# Patient Record
Sex: Female | Born: 1970
Health system: Southern US, Community
[De-identification: ages and names within clinical notes are randomized; demographics above are authoritative.]

## PROBLEM LIST (undated history)

## (undated) DIAGNOSIS — B009 Herpesviral infection, unspecified: Secondary | ICD-10-CM

## (undated) DIAGNOSIS — D573 Sickle-cell trait: Secondary | ICD-10-CM

## (undated) DIAGNOSIS — I10 Essential (primary) hypertension: Secondary | ICD-10-CM

## (undated) DIAGNOSIS — R079 Chest pain, unspecified: Secondary | ICD-10-CM

## (undated) DIAGNOSIS — D219 Benign neoplasm of connective and other soft tissue, unspecified: Secondary | ICD-10-CM

## (undated) HISTORY — DX: Essential (primary) hypertension: I10

## (undated) HISTORY — DX: Chest pain, unspecified: R07.9

## (undated) HISTORY — DX: Herpesviral infection, unspecified: B00.9

## (undated) HISTORY — DX: Sickle-cell trait: D57.3

## (undated) HISTORY — DX: Benign neoplasm of connective and other soft tissue, unspecified: D21.9

## (undated) HISTORY — PX: HAMMER TOE SURGERY: SHX385

---

## 2007-04-09 DIAGNOSIS — D219 Benign neoplasm of connective and other soft tissue, unspecified: Secondary | ICD-10-CM

## 2007-04-09 HISTORY — DX: Benign neoplasm of connective and other soft tissue, unspecified: D21.9

## 2007-04-09 HISTORY — PX: LAPAROSCOPIC TUBAL LIGATION: SUR803

## 2007-04-09 HISTORY — PX: MYOMECTOMY: SHX85

## 2013-08-27 ENCOUNTER — Encounter: Payer: Self-pay | Admitting: Gynecology

## 2013-09-22 ENCOUNTER — Ambulatory Visit (INDEPENDENT_AMBULATORY_CARE_PROVIDER_SITE_OTHER): Payer: Commercial Managed Care - PPO | Admitting: Gynecology

## 2013-09-22 ENCOUNTER — Encounter: Payer: Self-pay | Admitting: Gynecology

## 2013-09-22 VITALS — BP 112/66 | Resp 18 | Ht 61.75 in | Wt 134.0 lb

## 2013-09-22 DIAGNOSIS — Z01419 Encounter for gynecological examination (general) (routine) without abnormal findings: Secondary | ICD-10-CM

## 2013-09-22 DIAGNOSIS — Z124 Encounter for screening for malignant neoplasm of cervix: Secondary | ICD-10-CM

## 2013-09-22 DIAGNOSIS — B009 Herpesviral infection, unspecified: Secondary | ICD-10-CM | POA: Insufficient documentation

## 2013-09-22 DIAGNOSIS — D573 Sickle-cell trait: Secondary | ICD-10-CM | POA: Insufficient documentation

## 2013-09-22 DIAGNOSIS — Z0000 Encounter for general adult medical examination without abnormal findings: Secondary | ICD-10-CM

## 2013-09-22 DIAGNOSIS — I10 Essential (primary) hypertension: Secondary | ICD-10-CM | POA: Insufficient documentation

## 2013-09-22 LAB — POCT URINALYSIS DIPSTICK
Leukocytes, UA: NEGATIVE
Urobilinogen, UA: NEGATIVE
pH, UA: 5

## 2013-09-22 NOTE — Progress Notes (Signed)
43 y.o. Married  [place] American female   [phone] here for annual exam. Pt is currently sexually active. Cycles regular and light.  Not sexually active due to husband's issues.  Patient's last menstrual period was 09/13/2013.          Sexually active: yes The current method of family planning is BTL   Exercising: no  The patient does not participate in regular exercise at present. Last pap: 2014-wnl Alcohol: 3x/month occasionally  Tobacco: no BSE: no Mammogram- 2014   Urine: Negative    There are no preventive care reminders to display for this patient.  Family History  Problem Relation Age of Onset  . Breast cancer Maternal Aunt   . Diabetes Mellitus I Mother   . Diabetes Maternal Grandmother   . Hypertension Mother   . Hypertension Father   . Hypertension Maternal Grandmother   . Hypertension Sister   . Stroke Sister 40    HTN  . Heart attack Maternal Grandfather   . Heart attack Mother 64    Patient Active Problem List   Diagnosis Date Noted  . Hypertension   . Sickle cell trait     Past Medical History  Diagnosis Date  . Sickle cell trait   . Fibroid 2009  . Hypertension     Past Surgical History  Procedure Laterality Date  . Hammer toe surgery    . Myomectomy  2009  . Laparoscopic tubal ligation  2009    Allergies: Review of patient's allergies indicates no known allergies.  Current Outpatient Prescriptions  Medication Sig Dispense Refill  . ATENOLOL PO Take by mouth.      . Multiple Vitamins-Minerals (MULTIVITAMIN PO) Take by mouth.       No current facility-administered medications for this visit.    ROS: Pertinent items are noted in HPI.  Exam:    BP 112/66  Resp 18  Ht 5' 1.75" (1.568 m)  Wt 134 lb (60.782 kg)  BMI 24.72 kg/m2  LMP 09/13/2013 Weight change: @WEIGHTCHANGE @ Last 3 height recordings:  Ht Readings from Last 3 Encounters:  09/22/13 5' 1.75" (1.568 m)   General appearance: alert, cooperative and appears stated age Head:  Normocephalic, without obvious abnormality, atraumatic Neck: no adenopathy, no carotid bruit, no JVD, supple, symmetrical, trachea midline and thyroid not enlarged, symmetric, no tenderness/mass/nodules Lungs: clear to auscultation bilaterally Breasts: normal appearance, no masses or tenderness Heart: regular rate and rhythm, S1, S2 normal, no murmur, click, rub or gallop Abdomen: soft, non-tender; bowel sounds normal; no masses,  no organomegaly Extremities: extremities normal, atraumatic, no cyanosis or edema Skin: Skin color, texture, turgor normal. No rashes or lesions Lymph nodes: Cervical, supraclavicular, and axillary nodes normal. no inguinal nodes palpated Neurologic: Grossly normal   Pelvic: External genitalia:  normal escutcheon              Urethra: normal appearing urethra with no masses, tenderness or lesions              Bartholins and Skenes: Bartholin's, Urethra, Skene's normal                 Vagina: normal appearing vagina with normal color and discharge, no lesions              Cervix: normal appearance              Pap taken: yes        Bimanual Exam:  Uterus:  irregular, shape, consistency and nontender  Adnexa:    normal adnexa in size, nontender and no masses                                      Rectovaginal: Confirms                                      Anus:  normal sphincter tone, no lesions  A: well woman P: mammogram pap smear with HPV counseled on breast self exam, mammography screening, adequate intake of calcium and vitamin D, diet and exercise return annually or prn   An After Visit Summary was printed and given to the patient.

## 2013-09-27 LAB — IPS PAP TEST WITH HPV

## 2014-02-02 ENCOUNTER — Other Ambulatory Visit: Payer: Self-pay | Admitting: Gynecology

## 2014-02-02 ENCOUNTER — Telehealth: Payer: Self-pay | Admitting: Gynecology

## 2014-02-02 DIAGNOSIS — Z1231 Encounter for screening mammogram for malignant neoplasm of breast: Secondary | ICD-10-CM

## 2014-02-02 NOTE — Telephone Encounter (Signed)
Spoke with patient. Patient states that she had her cycle 10/6-10/9 then started bleeding again on 10/23-10/25. "My cycles are usually only three days long. This has never happened before." Patient's current method of birth control is a tubal ligation. Patient would like to come in to see Dr.Lathrop. Requesting appointment for 11/6. Appointment scheduled for 8:30am on 11/6 with Dr.Lathrop. Patient is agreeable to date and time.  Routing to provider for final review. Patient agreeable to disposition. Will close encounter

## 2014-02-02 NOTE — Telephone Encounter (Signed)
Pt is calling stating she had her cycle 10/6/-01/14/14 pt then had more bleeding on 01/28/14-01/29/14. Pt states her cycle usually only last about 3 days. Pt concerned thinks she may need appt

## 2014-02-07 ENCOUNTER — Encounter: Payer: Self-pay | Admitting: Gynecology

## 2014-02-11 ENCOUNTER — Ambulatory Visit: Payer: Commercial Managed Care - PPO | Admitting: Certified Nurse Midwife

## 2014-02-11 ENCOUNTER — Telehealth: Payer: Self-pay | Admitting: Certified Nurse Midwife

## 2014-02-11 ENCOUNTER — Ambulatory Visit: Payer: Commercial Managed Care - PPO | Admitting: Gynecology

## 2014-02-11 ENCOUNTER — Ambulatory Visit (HOSPITAL_COMMUNITY)
Admission: RE | Admit: 2014-02-11 | Discharge: 2014-02-11 | Disposition: A | Discharge status: Home / Self Care | Payer: Commercial Managed Care - PPO | Source: Ambulatory Visit | Attending: Gynecology | Admitting: Gynecology

## 2014-02-11 DIAGNOSIS — Z1231 Encounter for screening mammogram for malignant neoplasm of breast: Secondary | ICD-10-CM | POA: Insufficient documentation

## 2014-02-11 NOTE — Telephone Encounter (Signed)
Patient called to say she did not keep her appointment for "irregular cycles" with Johny Shock today due to starting her cycle last night. Patient advised to keep future appointments for this problem even if she is on her cycle. Patient will call back to reschedule and expressed understanding. I did not charge patient a missed appointment fee.

## 2014-06-22 ENCOUNTER — Telehealth: Payer: Self-pay | Admitting: Nurse Practitioner

## 2014-06-22 NOTE — Telephone Encounter (Signed)
°   ° ° °  Closed previous encounter by error. Changed to high priorty  Lucienne Minks at 06/22/2014 4:25 PM     Status: Signed       Expand All Collapse All   Patient is calling to speak with the nurse. Patient is on her break.            Zeb Comfort at 06/22/2014 2:07 PM     Status: Signed       Expand All Collapse All   Patient found a "pelvic lump". Patient is on a break will not be available again until 4:15-4:30 her next break. Last seen 09/22/13. No chart.

## 2014-06-22 NOTE — Telephone Encounter (Signed)
Call to patient. She is at work and cannot provide details to this Therapist, sports.  Prior patient of Dr. Charlies Constable. Concerned about lump in L vulvar area that she noticed two days ago. Also concerned about irregular cycles.  She declines any triage. Requests appointment with an MD only. Declines all offered appointments with NP's.  Patient also requests an office visit during lunch time hours from 12 to 1 pm with MD. Hanley Seamen first available appointment with Dr Quincy Simmonds for 06/27/14. Patient will call back with any change in symptoms prior.   Routing to provider for final review. Patient agreeable to disposition. Will close encounter

## 2014-06-22 NOTE — Telephone Encounter (Signed)
Patient is calling to speak with the nurse. Patient is on her break.

## 2014-06-22 NOTE — Telephone Encounter (Signed)
Patient found a "pelvic lump". Patient is on a break will not be available again until 4:15-4:30 her next break. Last seen 09/22/13. No chart.

## 2014-06-27 ENCOUNTER — Ambulatory Visit (INDEPENDENT_AMBULATORY_CARE_PROVIDER_SITE_OTHER): Payer: Commercial Managed Care - PPO | Admitting: Certified Nurse Midwife

## 2014-06-27 ENCOUNTER — Encounter: Payer: Self-pay | Admitting: Certified Nurse Midwife

## 2014-06-27 ENCOUNTER — Ambulatory Visit: Payer: Commercial Managed Care - PPO | Admitting: Obstetrics and Gynecology

## 2014-06-27 ENCOUNTER — Telehealth: Payer: Self-pay | Admitting: Emergency Medicine

## 2014-06-27 VITALS — BP 100/80 | HR 68 | Temp 98.0°F | Ht 61.75 in | Wt 146.0 lb

## 2014-06-27 DIAGNOSIS — L731 Pseudofolliculitis barbae: Secondary | ICD-10-CM

## 2014-06-27 NOTE — Telephone Encounter (Signed)
Spoke with patient. Advised of need to reschedule appointment due to provider availability.  She requests to be rescheduled with any provider today as she has taken off of work to be seen today.   Rescheduled appointment with Regina Eck CNM for evaluation of Vulvar Lump.  Routing to provider for final review. Patient agreeable to disposition. Will close encounter

## 2014-06-27 NOTE — Patient Instructions (Signed)
°

## 2014-06-27 NOTE — Progress Notes (Signed)
44 y.o.Married african american female [id number] here with complaint of vaginal bump on left side. Denies pain, itching, exudate, redness or blister. History of HSV 2.  Describes discharge as  Normal. Patient does not wear thongs, does shave in pubic area or clip.Onset of symptoms 1-2 weeks ago or maybe longer . Denies new personal products. No STD concerns. Urinary symptoms none . Contraception is BTL. Patient also has bump behind right ear that she saw PCP for this am and will be having CT scan for. No other health issues today.   O:Healthy female WDWN Affect: normal, orientation x 3  Exam: Abdomen:soft, non tender  Inguinal Lymph node: no enlargement or tenderness Pelvic exam: External genital: normal female with small ingrown hair noted on left mons pubis, non tender, no exudate, barely raised. BUS: negative Declines vaginal exam due to location of bump, patient acknowledges this is the area of concern.  Wet Prep results: not done   A:Ingrown pubic hair no inflammation noted   P:Discussed findings of ingrown hair bump and etiology. Discussed epsom salt sitz bath or soak, which will help resolve area. Avoid shaving area or cutting close. Warning signs of inflammation or infection given. Avoid squeezing area which will cause irritation. Should spontaneous clear or no change.  Rv prn

## 2014-07-06 NOTE — Progress Notes (Signed)
Reviewed personally.  M. Suzanne Darrion Macaulay, MD.  

## 2014-09-23 ENCOUNTER — Telehealth: Payer: Self-pay | Admitting: Nurse Practitioner

## 2014-09-23 NOTE — Telephone Encounter (Signed)
Left patient a message to call back when ready to reschedule, canceled by automated reminder call. °

## 2014-09-27 ENCOUNTER — Ambulatory Visit: Payer: Commercial Managed Care - PPO | Admitting: Nurse Practitioner

## 2015-02-09 ENCOUNTER — Other Ambulatory Visit: Payer: Self-pay

## 2015-02-09 DIAGNOSIS — Z1231 Encounter for screening mammogram for malignant neoplasm of breast: Secondary | ICD-10-CM

## 2015-02-23 ENCOUNTER — Encounter: Payer: Self-pay | Admitting: Certified Nurse Midwife

## 2015-02-23 ENCOUNTER — Ambulatory Visit (INDEPENDENT_AMBULATORY_CARE_PROVIDER_SITE_OTHER): Payer: Commercial Managed Care - PPO | Admitting: Certified Nurse Midwife

## 2015-02-23 VITALS — BP 104/64 | HR 68 | Resp 16 | Ht 61.5 in | Wt 143.0 lb

## 2015-02-23 DIAGNOSIS — Z124 Encounter for screening for malignant neoplasm of cervix: Secondary | ICD-10-CM

## 2015-02-23 DIAGNOSIS — Z0000 Encounter for general adult medical examination without abnormal findings: Secondary | ICD-10-CM

## 2015-02-23 DIAGNOSIS — Z01419 Encounter for gynecological examination (general) (routine) without abnormal findings: Secondary | ICD-10-CM | POA: Diagnosis not present

## 2015-02-23 LAB — POCT URINALYSIS DIPSTICK
Bilirubin, UA: NEGATIVE
Blood, UA: NEGATIVE
Glucose, UA: NEGATIVE
Ketones, UA: NEGATIVE
Leukocytes, UA: NEGATIVE
Nitrite, UA: NEGATIVE
Protein, UA: NEGATIVE
Urobilinogen, UA: NEGATIVE
pH, UA: 7

## 2015-02-23 NOTE — Patient Instructions (Signed)

## 2015-02-23 NOTE — Progress Notes (Signed)
44 y.o. [id number] Married  African American Fe here for annual exam. Periods normal, no issues. Seeing PCP for Hypertension management,labs/aex. All normal. Working on getting motivated for weight loss. Had fall today and hit head, but no vision changes or headaches. Aware if symptoms occur needs to be seen in ER. May be changing employment in next year to [employer]. No other health issues today.  Patient's last menstrual period was 01/23/2015.          Sexually active: Yes.    The current method of family planning is tubal ligation.    Exercising: No.  exercise Smoker:  no  Health Maintenance: Pap: 09-22-13 ASCUS HPV HR neg MMG:  02-11-14 category c birads 1:neg scheduled for 02/24/15 Colonoscopy:  none BMD:   none TDaP:  2015 Labs: poct urine-ph 7.0 Self breast exam: done occ   reports that she has never smoked. She has never used smokeless tobacco. She reports that she drinks alcohol. She reports that she does not use illicit drugs.  Past Medical History  Diagnosis Date  . Sickle cell trait ([place])   . Fibroid 2009  . Hypertension   . Herpes     Past Surgical History  Procedure Laterality Date  . Hammer toe surgery    . Myomectomy  2009  . Laparoscopic tubal ligation  2009    Current Outpatient Prescriptions  Medication Sig Dispense Refill  . atenolol-chlorthalidone (TENORETIC) 50-25 MG per tablet Take 1 tablet by mouth daily.  5   No current facility-administered medications for this visit.    Family History  Problem Relation Age of Onset  . Breast cancer Maternal Aunt   . Diabetes Mellitus I Mother   . Diabetes Maternal Grandmother   . Hypertension Mother   . Hypertension Father   . Hypertension Maternal Grandmother   . Hypertension Sister   . Stroke Sister 72    HTN  . Heart attack Maternal Grandfather   . Heart attack Mother 59    ROS:  Pertinent items are noted in HPI.  Otherwise, a comprehensive ROS was negative.  Exam:   BP 104/64 mmHg  Pulse 68   Resp 16  Ht 5' 1.5" (1.562 m)  Wt 143 lb (64.864 kg)  BMI 26.59 kg/m2  LMP 01/23/2015 Height: 5' 1.5" (156.2 cm) Ht Readings from Last 3 Encounters:  02/23/15 5' 1.5" (1.562 m)  06/27/14 5' 1.75" (1.568 m)  09/22/13 5' 1.75" (1.568 m)    General appearance: alert, cooperative and appears stated age Head: Normocephalic, without obvious abnormality, atraumatic Neck: no adenopathy, supple, symmetrical, trachea midline and thyroid normal to inspection and palpation Lungs: clear to auscultation bilaterally Breasts: normal appearance, no masses or tenderness, No nipple retraction or dimpling, No nipple discharge or bleeding, No axillary or supraclavicular adenopathy Heart: regular rate and rhythm Abdomen: soft, non-tender; no masses,  no organomegaly Extremities: extremities normal, atraumatic, no cyanosis or edema Skin: Skin color, texture, turgor normal. No rashes or lesions Lymph nodes: Cervical, supraclavicular, and axillary nodes normal. No abnormal inguinal nodes palpated Neurologic: Grossly normal   Pelvic: External genitalia:  no lesions              Urethra:  normal appearing urethra with no masses, tenderness or lesions              Bartholin's and Skene's: normal                 Vagina: normal appearing vagina with normal color and discharge, no  lesions              Cervix: normal,non tender,no lesions              Pap taken: Yes.   Bimanual Exam:  Uterus:  normal size, contour, position, consistency, mobility, non-tender              Adnexa: normal adnexa and no mass, fullness, tenderness               Rectovaginal: Confirms               Anus:  normal sphincter tone, no lesions  Chaperone present: yes  A:  Well Woman with normal exam  Contraception BTL  History of ASCUS - HPVHR, follow up pap today  Hypertension with PCP management, stable  P:   Reviewed health and wellness pertinent to exam  Discussed with patient if pap normal repeat in one year, if not per  results.  Continue follow up as indicated with PCP  Pap smear as above taken with HPVHR   counseled on breast self exam, mammography screening, adequate intake of calcium and vitamin D, diet and exercise return annually or prn  An After Visit Summary was printed and given to the patient.

## 2015-02-24 ENCOUNTER — Ambulatory Visit
Admission: RE | Admit: 2015-02-24 | Discharge: 2015-02-24 | Disposition: A | Discharge status: Home / Self Care | Payer: Commercial Managed Care - PPO | Source: Ambulatory Visit

## 2015-02-24 DIAGNOSIS — Z1231 Encounter for screening mammogram for malignant neoplasm of breast: Secondary | ICD-10-CM

## 2015-02-26 NOTE — Progress Notes (Signed)
Reviewed personally.  M. Suzanne Soliyana Mcchristian, MD.  

## 2015-02-27 LAB — IPS PAP TEST WITH HPV

## 2015-05-01 ENCOUNTER — Other Ambulatory Visit: Payer: Self-pay | Admitting: Family Medicine

## 2015-05-01 DIAGNOSIS — H9209 Otalgia, unspecified ear: Secondary | ICD-10-CM

## 2015-05-02 ENCOUNTER — Ambulatory Visit
Admission: RE | Admit: 2015-05-02 | Discharge: 2015-05-02 | Disposition: A | Discharge status: Home / Self Care | Payer: Commercial Managed Care - PPO | Source: Ambulatory Visit | Attending: Family Medicine | Admitting: Family Medicine

## 2015-05-02 DIAGNOSIS — H9209 Otalgia, unspecified ear: Secondary | ICD-10-CM

## 2015-05-02 MED ORDER — IOPAMIDOL (ISOVUE-300) INJECTION 61%
75.0000 mL | Freq: Once | INTRAVENOUS | Status: AC | PRN
  Administered 2015-05-02: 75 mL via INTRAVENOUS

## 2016-02-21 ENCOUNTER — Other Ambulatory Visit: Payer: Self-pay | Admitting: Family Medicine

## 2016-02-21 DIAGNOSIS — Z1231 Encounter for screening mammogram for malignant neoplasm of breast: Secondary | ICD-10-CM

## 2016-02-27 ENCOUNTER — Encounter: Payer: Self-pay | Admitting: Certified Nurse Midwife

## 2016-02-27 ENCOUNTER — Ambulatory Visit (INDEPENDENT_AMBULATORY_CARE_PROVIDER_SITE_OTHER): Payer: Commercial Managed Care - PPO | Admitting: Certified Nurse Midwife

## 2016-02-27 VITALS — BP 112/68 | HR 70 | Resp 16 | Ht 61.75 in | Wt 139.0 lb

## 2016-02-27 DIAGNOSIS — Z0000 Encounter for general adult medical examination without abnormal findings: Secondary | ICD-10-CM

## 2016-02-27 DIAGNOSIS — Z01419 Encounter for gynecological examination (general) (routine) without abnormal findings: Secondary | ICD-10-CM | POA: Diagnosis not present

## 2016-02-27 LAB — TSH: TSH: 0.81 mIU/L

## 2016-02-27 LAB — POCT URINALYSIS DIPSTICK
BILIRUBIN UA: NEGATIVE
Blood, UA: NEGATIVE
Glucose, UA: NEGATIVE
KETONES UA: NEGATIVE
LEUKOCYTES UA: NEGATIVE
NITRITE UA: NEGATIVE
PH UA: 5
PROTEIN UA: NEGATIVE
Urobilinogen, UA: NEGATIVE

## 2016-02-27 LAB — LIPID PANEL
CHOLESTEROL: 168 mg/dL (ref <200)
HDL: 70 mg/dL (ref >50)
LDL Cholesterol: 82 mg/dL (ref <100)
TRIGLYCERIDES: 79 mg/dL (ref <150)
Total CHOL/HDL Ratio: 2.4 Ratio (ref <5.0)
VLDL: 16 mg/dL (ref <30)

## 2016-02-27 NOTE — Progress Notes (Signed)
45 y.o. [id number] Married  African American Fe here for annual exam. Periods normal, no issues. Patient has been on folic acid in past and plans to start again. 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 Sees [facility] for atenolol for hypertension management , just established, no screening labs. Aware mammogram due and plans to schedule soon. No other health issues today. Spouse cooking Thanksgiving!  Patient's last menstrual period was 01/22/2016 (exact date).          Sexually active: No.  The current method of family planning is tubal ligation.    Exercising: No.  exercise Smoker:  no  Health Maintenance: Pap:  02-23-15 neg HPV HR neg MMG:  02-24-15 category c density birads 1:neg Colonoscopy:  none BMD:   none TDaP:  2015 Shingles: no Pneumonia: no Hep C and HIV: HIV neg yrs ago Labs: poct urine-neg Self breast exam: done occ   reports that she has never smoked. She has never used smokeless tobacco. She reports that she drinks alcohol. She reports that she does not use drugs.  Past Medical History:  Diagnosis Date  . Fibroid 2009  . Herpes   . Hypertension   . Sickle cell trait [facility])     Past Surgical History:  Procedure Laterality Date  . HAMMER TOE SURGERY    . LAPAROSCOPIC TUBAL LIGATION  2009  . MYOMECTOMY  2009    Current Outpatient Prescriptions  Medication Sig Dispense Refill  . atenolol-chlorthalidone (TENORETIC) 50-25 MG per tablet Take 1 tablet by mouth daily.  5   No current facility-administered medications for this visit.     Family History  Problem Relation Age of Onset  . Breast cancer Maternal Aunt   . Diabetes Mellitus I Mother   . Hypertension Mother   . Heart attack Mother 39  . Diabetes Maternal Grandmother   . Hypertension Maternal Grandmother   . Hypertension Father   . Hypertension Sister   . Stroke Sister 66    HTN  . Heart attack Maternal Grandfather     ROS:  Pertinent items are noted in HPI.  Otherwise, a comprehensive ROS was  negative.  Exam:   BP 112/68   Pulse 70   Resp 16   Ht 5' 1.75" (1.568 m)   Wt 139 lb (63 kg)   LMP 01/22/2016 (Exact Date) Comment: spotting today  BMI 25.63 kg/m  Height: 5' 1.75" (156.8 cm) Ht Readings from Last 3 Encounters:  02/27/16 5' 1.75" (1.568 m)  02/23/15 5' 1.5" (1.562 m)  06/27/14 5' 1.75" (1.568 m)    General appearance: alert, cooperative and appears stated age Head: Normocephalic, without obvious abnormality, atraumatic Neck: no adenopathy, supple, symmetrical, trachea midline and thyroid normal to inspection and palpation Lungs: clear to auscultation bilaterally Breasts: normal appearance, no masses or tenderness, No nipple retraction or dimpling, No nipple discharge or bleeding, No axillary or supraclavicular adenopathy Heart: regular rate and rhythm Abdomen: soft, non-tender; no masses,  no organomegaly Extremities: extremities normal, atraumatic, no cyanosis or edema Skin: Skin color, texture, turgor normal. No rashes or lesions Lymph nodes: Cervical, supraclavicular, and axillary nodes normal. No abnormal inguinal nodes palpated Neurologic: Grossly normal   Pelvic: External genitalia:  no lesions              Urethra:  normal appearing urethra with no masses, tenderness or lesions              Bartholin's and Skene's: normal  Vagina: normal appearing vagina with normal color and discharge, no lesions              Cervix: multiparous appearance, no cervical motion tenderness and no lesions              Pap taken: No. Bimanual Exam:  Uterus:  normal size, contour, position, consistency, mobility, non-tender and anteverted              Adnexa: normal adnexa and no mass, fullness, tenderness               Rectovaginal: Confirms               Anus:  normal sphincter tone, no lesions  Chaperone present: yes  A:  Well Woman with normal exam  Contraception tubal  Hypertension with PCP management  Screening labs  P:   Reviewed health and  wellness pertinent to exam  Continue follow up with MD as indicated.  Labs: Lipid panel, TSH, Vitamin D  Pap smear as above not taken   counseled on breast self exam, mammography screening, adequate intake of calcium and vitamin D, diet and exercise  return annually or prn  An After Visit Summary was printed and given to the patient.

## 2016-02-27 NOTE — Patient Instructions (Signed)

## 2016-02-28 ENCOUNTER — Other Ambulatory Visit: Payer: Self-pay

## 2016-02-28 ENCOUNTER — Telehealth: Payer: Self-pay

## 2016-02-28 DIAGNOSIS — E559 Vitamin D deficiency, unspecified: Secondary | ICD-10-CM

## 2016-02-28 LAB — VITAMIN D 25 HYDROXY (VIT D DEFICIENCY, FRACTURES): VIT D 25 HYDROXY: 19 ng/mL — AB (ref 30–100)

## 2016-02-28 MED ORDER — VITAMIN D (ERGOCALCIFEROL) 1.25 MG (50000 UNIT) PO CAPS
50000.0000 [IU] | ORAL_CAPSULE | ORAL | 0 refills | Status: DC
Start: 2016-02-28 — End: 2016-06-18

## 2016-02-28 NOTE — Telephone Encounter (Signed)
-----   Message from Regina Eck, CNM sent at 02/28/2016  7:49 AM EST ----- Notify patient Vitamin D is low, needs Rx and recheck in 3 months TSH is normal Lipid panel all normal

## 2016-02-28 NOTE — Telephone Encounter (Signed)
lmtcb

## 2016-02-28 NOTE — Telephone Encounter (Signed)
Left message for call back.

## 2016-02-28 NOTE — Telephone Encounter (Signed)
Patient returning call.

## 2016-02-28 NOTE — Telephone Encounter (Signed)
Pt notified in result note.  Closing encounter. 

## 2016-03-01 NOTE — Progress Notes (Signed)
Encounter reviewed Beuford Garcilazo, MD   

## 2016-03-27 ENCOUNTER — Telehealth: Payer: Self-pay | Admitting: Certified Nurse Midwife

## 2016-03-27 NOTE — Telephone Encounter (Addendum)
Patient wants to speak with the nurse. She wants to know what labwork she had done at her last visit. She has an appointment today at her other doctor's office and want to let them know. She can be reached at [phone].

## 2016-03-27 NOTE — Telephone Encounter (Signed)
Left message to call Sharee Pimple at [phone].  Last AEX 02/27/16 -lipid panel, Vit D, TSH

## 2016-04-03 NOTE — Telephone Encounter (Signed)
Left message to call Mical Brun at [phone].  

## 2016-04-09 NOTE — Telephone Encounter (Signed)
Melvia Heaps, CNM -returned call x2 with no return call -please advise?

## 2016-04-09 NOTE — Telephone Encounter (Signed)
Close encounter, she will call if she needs further assistance

## 2016-05-21 ENCOUNTER — Other Ambulatory Visit: Payer: Self-pay | Admitting: Certified Nurse Midwife

## 2016-06-05 ENCOUNTER — Telehealth: Payer: Self-pay | Admitting: *Deleted

## 2016-06-05 NOTE — Telephone Encounter (Signed)
Message left to return call.  [phone] (Mobile) *Preferred*

## 2016-06-05 NOTE — Telephone Encounter (Signed)
-----   Message from Graylon Good, [place] sent at 02/28/2016  1:58 PM EST ----- Regarding: repeat vit d Pt needs vit d 12 weeks from 02/28/16 DL patient.

## 2016-06-11 NOTE — Telephone Encounter (Signed)
Return call from patient.  Lab appointment scheduled for 06/14/16 @ 1:30pm.

## 2016-06-13 ENCOUNTER — Other Ambulatory Visit (INDEPENDENT_AMBULATORY_CARE_PROVIDER_SITE_OTHER): Payer: 59

## 2016-06-13 DIAGNOSIS — E559 Vitamin D deficiency, unspecified: Secondary | ICD-10-CM

## 2016-06-14 ENCOUNTER — Other Ambulatory Visit: Payer: Self-pay

## 2016-06-14 ENCOUNTER — Telehealth: Payer: Self-pay

## 2016-06-14 DIAGNOSIS — E559 Vitamin D deficiency, unspecified: Secondary | ICD-10-CM

## 2016-06-14 LAB — VITAMIN D 25 HYDROXY (VIT D DEFICIENCY, FRACTURES): VIT D 25 HYDROXY: 33 ng/mL (ref 30–100)

## 2016-06-14 NOTE — Telephone Encounter (Signed)
lmtcb

## 2016-06-17 ENCOUNTER — Ambulatory Visit: Payer: Commercial Managed Care - PPO

## 2016-06-17 NOTE — Telephone Encounter (Signed)
Patient is returning a call to [place]. I told patient Caryl Asp has gone for the day. Patient is asking if another nurse could give her a call with her lab results. I told patient that our office is closing early and she may not receive a call back today.

## 2016-06-17 NOTE — Telephone Encounter (Signed)
Left message for call back.

## 2016-06-18 MED ORDER — VITAMIN D (ERGOCALCIFEROL) 1.25 MG (50000 UNIT) PO CAPS
50000.0000 [IU] | ORAL_CAPSULE | ORAL | 0 refills | Status: DC
Start: 2016-06-18 — End: 2017-04-17

## 2016-06-18 NOTE — Telephone Encounter (Signed)
Patient notified by triage.

## 2016-06-18 NOTE — Telephone Encounter (Signed)
Left message to call back  

## 2016-06-18 NOTE — Telephone Encounter (Signed)
Spoke with patient, advised of results and recommendations as seen below per Melvia Heaps, CNM. Rx for Vit D 50,000 IU sent to verified pharmacy on file. Patient scheduled for 3 month Vit D recheck 09/20/16 at 1:30pm. Patient verbalizes understanding and is agreeable.  Routing to provider for final review. Patient is agreeable to disposition. Will close encounter.

## 2016-06-18 NOTE — Telephone Encounter (Signed)
Patient returning call.

## 2016-06-18 NOTE — Telephone Encounter (Signed)
Left message to return call to any triage nurse or Joy [phone].  Notes Recorded by Regina Eck, CNM on 06/14/2016 at 7:56 AM EST Notify patient that Vitamin D is better, borderline normal, continue Rx and recheck in 3 months Please sche.

## 2016-07-26 ENCOUNTER — Encounter: Payer: Self-pay | Admitting: Cardiovascular Disease

## 2016-07-26 DIAGNOSIS — R079 Chest pain, unspecified: Secondary | ICD-10-CM | POA: Diagnosis not present

## 2016-07-26 DIAGNOSIS — I10 Essential (primary) hypertension: Secondary | ICD-10-CM | POA: Diagnosis not present

## 2016-08-12 ENCOUNTER — Telehealth: Payer: Self-pay

## 2016-08-12 NOTE — Telephone Encounter (Signed)
SENT NOTES TO SCHEDULING 

## 2016-08-23 ENCOUNTER — Telehealth: Payer: Self-pay | Admitting: Cardiovascular Disease

## 2016-08-23 NOTE — Telephone Encounter (Signed)
Received records from [facility] for appointment on 08/30/16 with Dr Oval Linsey.  Records put with Dr Blenda Mounts schedule on 08/30/16.

## 2016-08-30 ENCOUNTER — Encounter: Payer: Self-pay | Admitting: Cardiovascular Disease

## 2016-08-30 ENCOUNTER — Ambulatory Visit (INDEPENDENT_AMBULATORY_CARE_PROVIDER_SITE_OTHER): Payer: 59 | Admitting: Cardiovascular Disease

## 2016-08-30 VITALS — BP 125/78 | HR 68 | Ht 61.0 in | Wt 146.6 lb

## 2016-08-30 DIAGNOSIS — R002 Palpitations: Secondary | ICD-10-CM | POA: Diagnosis not present

## 2016-08-30 DIAGNOSIS — R079 Chest pain, unspecified: Secondary | ICD-10-CM | POA: Diagnosis not present

## 2016-08-30 NOTE — Patient Instructions (Addendum)
Medication Instructions:  Your physician recommends that you continue on your current medications as directed. Please refer to the Current Medication list given to you today.  Labwork: FT4/TSH TODAY   Testing/Procedures: Your physician has requested that you have an exercise tolerance test. For further information please visit [email or web address]. Please also follow instruction sheet, as given. HOLD YOUR BLOOD PRESSURE MEDICATION THE DAY BEFORE AND THE MORNING OF  Follow-Up: Your physician wants you to follow-up in: Tiffany Davila will receive a reminder letter in the mail two months in advance. If you don't receive a letter, please call our office to schedule the follow-up appointment.  If you need a refill on your cardiac medications before your next appointment, please call your pharmacy.

## 2016-08-30 NOTE — Progress Notes (Signed)
Cardiology Office Note   Date:  09/01/2016   ID:  Tiffany Davila, DOB 19-Aug-1970, MRN [id number]  PCP:  Tiffany Prose, MD  Cardiologist:   Tiffany Latch, MD   Chief Complaint  Patient presents with  . New Patient (Initial Visit)      History of Present Illness: Tiffany Davila is a 46 y.o. female with hypertension and sickle cell trait who is being seen today for the evaluation of chest pain at the request of Tiffany Prose, MD.  Tiffany Davila saw Dr. Nancy Davila on 07/26/16. She repleted one day of right sided chest tightness radiating to her neck EKG was unremarkable. She was referred to cardiology for further evaluation.  She reports that she has experienced chest tightness off and on over the years.  It is typically right sided and does not radiate.  She describes it as both sharp and tightness that is 3/10 in severity.  There is no associated shortness of breath, nausea or diaphoresis.  Episodes typically occur when she is feeling stressed.  Tiffany Davila works for the [employer] and has a stressful job. She also has two teenage children.  The pain lasts for a few seconds and often reurs throughout the day.  Tiffany Davila doesn't exercise but she does do household chores such as vaccuming and mopping.  She denies chest pain with these activites.  She sometimes has lower extremity edema after standing for long periods of time but it improves with elevation.  She denies orthopnea or PND.   Past Medical History:  Diagnosis Date  . Chest pain   . Fibroid 2009  . Herpes   . Hypertension   . Sickle cell trait [facility])     Past Surgical History:  Procedure Laterality Date  . HAMMER TOE SURGERY    . LAPAROSCOPIC TUBAL LIGATION  2009  . MYOMECTOMY  2009     Current Outpatient Prescriptions  Medication Sig Dispense Refill  . atenolol-chlorthalidone (TENORETIC) 50-25 MG per tablet Take 1 tablet by mouth daily.  5  . Vitamin D, Ergocalciferol, (DRISDOL) 50000 units CAPS capsule Take 1  capsule (50,000 Units total) by mouth every 7 (seven) days. 12 capsule 0   No current facility-administered medications for this visit.     Allergies:   Patient has no known allergies.    Social History:  The patient  reports that she has never smoked. She has never used smokeless tobacco. She reports that she drinks alcohol. She reports that she does not use drugs.   Family History:  The patient's family history includes Breast cancer in her maternal aunt; COPD in her mother; Diabetes in her maternal grandmother; Diabetes Mellitus I in her mother; Heart attack in her maternal grandfather; Heart attack (age of onset: 60) in her mother; Hypertension in her father, maternal grandmother, mother, and sister; Neuropathy in her mother; Stomach cancer in her sister; Stroke (age of onset: 15) in her sister.    ROS:  Please see the history of present illness.   Otherwise, review of systems are positive for hot/cold intolerance.  All other systems are reviewed and negative.    PHYSICAL EXAM: VS:  BP 125/78   Pulse 68   Ht 5\' 1"  (1.549 m)   Wt 66.5 kg (146 lb 9.6 oz)   BMI 27.70 kg/m  , BMI Body mass index is 27.7 kg/m. GENERAL:  Well appearing.  No acute distress HEENT:  Pupils equal round and reactive, fundi not visualized, oral mucosa unremarkable  NECK:  No jugular venous distention, waveform within normal limits, carotid upstroke brisk and symmetric, no bruits, no thyromegaly LYMPHATICS:  No cervical adenopathy LUNGS:  Clear to auscultation bilaterally HEART:  RRR.  PMI not displaced or sustained,S1 and S2 within normal limits, no S3, no S4, no clicks, no rubs, no murmurs ABD:  Flat, positive bowel sounds normal in frequency in pitch, no bruits, no rebound, no guarding, no midline pulsatile mass, no hepatomegaly, no splenomegaly EXT:  2 plus pulses throughout, no edema, no cyanosis no clubbing SKIN:  No rashes no nodules NEURO:  Cranial nerves II through XII grossly intact, motor grossly  intact throughout PSYCH:  Cognitively intact, oriented to person place and time    EKG:  EKG is not ordered today. The ekg ordered 08/30/16 demonstrates sinus rhythm 68 bpm.   07/26/16: Sinus rhythm. Rate 61 bpm.   Recent Labs: 08/30/2016: TSH 0.962   03/2016:  sodium 140, potassium 4.3, BUN 10, creatinine 0.58 WBC 6.9, hemoglobin 14, hematocrit 41.9, platelets 404  04/26/15: Total cholesterol 161, triglycerides 92, HDL 70, LDL 73  Lipid Panel    Component Value Date/Time   CHOL 168 02/27/2016 1603   TRIG 79 02/27/2016 1603   HDL 70 02/27/2016 1603   CHOLHDL 2.4 02/27/2016 1603   VLDL 16 02/27/2016 1603   LDLCALC 82 02/27/2016 1603      Wt Readings from Last 3 Encounters:  08/30/16 66.5 kg (146 lb 9.6 oz)  02/27/16 63 kg (139 lb)  02/23/15 64.9 kg (143 lb)      ASSESSMENT AND PLAN:  # Atypical chest pain: Symptoms are very atypical.  We will get an  ETT to rule out ischemia.  We will also check a TSH, free T4.  # Hypertension: Blood pressure is controlled on atenolol and chlorthalidone.  Current medicines are reviewed at length with the patient today.  The patient does not have concerns regarding medicines.  The following changes have been made:  no change  Labs/ tests ordered today include:   Orders Placed This Encounter  Procedures  . T4, free  . TSH  . Exercise Tolerance Test  . EKG 12-Lead     Disposition:   FU with Tiffany Sol C. Oval Linsey, MD, [facility] in 1 year    This note was written with the assistance of speech recognition software.  Please excuse any transcriptional errors.  Signed, Tiffany Mones C. Oval Linsey, MD, [facility]  09/01/2016 10:24 PM    [place]

## 2016-08-31 LAB — T4, FREE: Free T4: 1.24 ng/dL (ref 0.82–1.77)

## 2016-08-31 LAB — TSH: TSH: 0.962 u[IU]/mL (ref 0.450–4.500)

## 2016-09-04 ENCOUNTER — Telehealth (HOSPITAL_COMMUNITY): Payer: Self-pay

## 2016-09-04 NOTE — Telephone Encounter (Signed)
Message left. Awaiting return call at this time. Encounter complete.

## 2016-09-06 ENCOUNTER — Inpatient Hospital Stay (HOSPITAL_COMMUNITY): Admission: RE | Admit: 2016-09-06 | Payer: 59 | Source: Ambulatory Visit

## 2016-09-09 ENCOUNTER — Telehealth: Payer: Self-pay | Admitting: Cardiovascular Disease

## 2016-09-09 NOTE — Telephone Encounter (Signed)
No answer

## 2016-09-09 NOTE — Telephone Encounter (Signed)
Turning your call,will be at work until 5:00.You can call her after 5:00 at [phone].

## 2016-09-11 NOTE — Telephone Encounter (Signed)
Advised patient of lab results  

## 2016-09-11 NOTE — Telephone Encounter (Signed)
-----   Message from Skeet Latch, MD sent at 09/09/2016  1:08 PM EDT ----- Normal thyroid function

## 2016-09-18 NOTE — Addendum Note (Signed)
Addended by: Graylon Good on: 09/18/2016 05:12 PM   Modules accepted: Orders

## 2016-09-19 ENCOUNTER — Telehealth: Payer: Self-pay | Admitting: Certified Nurse Midwife

## 2016-09-19 ENCOUNTER — Ambulatory Visit
Admission: RE | Admit: 2016-09-19 | Discharge: 2016-09-19 | Disposition: A | Discharge status: Home / Self Care | Payer: 59 | Source: Ambulatory Visit | Attending: Family Medicine | Admitting: Family Medicine

## 2016-09-19 ENCOUNTER — Other Ambulatory Visit: Payer: 59

## 2016-09-19 DIAGNOSIS — Z1231 Encounter for screening mammogram for malignant neoplasm of breast: Secondary | ICD-10-CM

## 2016-09-19 NOTE — Telephone Encounter (Signed)
Patient cancelled her vitamin D recheck appointment today. She will call back to reschedule.

## 2016-09-20 ENCOUNTER — Other Ambulatory Visit: Payer: Self-pay

## 2016-09-20 NOTE — Telephone Encounter (Signed)
Can you follow this

## 2016-09-24 ENCOUNTER — Telehealth (HOSPITAL_COMMUNITY): Payer: Self-pay

## 2016-09-24 NOTE — Telephone Encounter (Signed)
Encounter complete. 

## 2016-09-25 ENCOUNTER — Telehealth (HOSPITAL_COMMUNITY): Payer: Self-pay

## 2016-09-25 NOTE — Telephone Encounter (Signed)
Encounter complete. 

## 2016-09-26 NOTE — Telephone Encounter (Signed)
Left message for patient to callback & schedule vit d lab appt.

## 2016-10-01 ENCOUNTER — Ambulatory Visit (HOSPITAL_COMMUNITY)
Admission: RE | Admit: 2016-10-01 | Discharge: 2016-10-01 | Disposition: A | Discharge status: Home / Self Care | Payer: 59 | Source: Ambulatory Visit | Attending: Cardiovascular Disease | Admitting: Cardiovascular Disease

## 2016-10-01 DIAGNOSIS — R079 Chest pain, unspecified: Secondary | ICD-10-CM | POA: Insufficient documentation

## 2016-10-01 DIAGNOSIS — R002 Palpitations: Secondary | ICD-10-CM | POA: Insufficient documentation

## 2016-10-01 LAB — EXERCISE TOLERANCE TEST
CHL CUP MPHR: 173 {beats}/min
CHL CUP RESTING HR STRESS: 67 {beats}/min
CHL RATE OF PERCEIVED EXERTION: 18
CSEPEDS: 1 s
Estimated workload: 10.1 METS
Exercise duration (min): 9 min
Peak HR: 179 {beats}/min
Percent HR: 103 %

## 2016-10-02 ENCOUNTER — Telehealth: Payer: Self-pay | Admitting: *Deleted

## 2016-10-02 NOTE — Telephone Encounter (Signed)
Left message for patient to call & reschedule vit d lab appt. No appt scheduled. Okay to close encounter?

## 2016-10-02 NOTE — Telephone Encounter (Addendum)
-----   Message from Skeet Latch, MD sent at 10/02/2016  2:07 PM EDT ----- Normal stress test   Left message for pt to call

## 2016-10-02 NOTE — Telephone Encounter (Signed)
Ok to close

## 2016-10-15 ENCOUNTER — Encounter: Payer: Self-pay | Admitting: *Deleted

## 2016-10-15 NOTE — Telephone Encounter (Signed)
Unable to reach via phone again, mailed letter with stress test results

## 2016-11-06 ENCOUNTER — Other Ambulatory Visit: Payer: Self-pay

## 2016-11-06 ENCOUNTER — Telehealth: Payer: Self-pay | Admitting: Certified Nurse Midwife

## 2016-11-06 DIAGNOSIS — E559 Vitamin D deficiency, unspecified: Secondary | ICD-10-CM

## 2016-11-06 NOTE — Telephone Encounter (Signed)
Patient coming for vit d labs Thursday  and will need orders put in system.

## 2016-11-06 NOTE — Telephone Encounter (Signed)
Vitamin d lab order placed.

## 2016-11-07 ENCOUNTER — Other Ambulatory Visit (INDEPENDENT_AMBULATORY_CARE_PROVIDER_SITE_OTHER): Payer: 59

## 2016-11-07 DIAGNOSIS — E559 Vitamin D deficiency, unspecified: Secondary | ICD-10-CM

## 2016-11-07 NOTE — Telephone Encounter (Signed)
Order placed. Will close encounter. Routing to provider for final review.

## 2016-11-08 LAB — VITAMIN D 25 HYDROXY (VIT D DEFICIENCY, FRACTURES): Vit D, 25-Hydroxy: 46.2 ng/mL (ref 30.0–100.0)

## 2016-11-11 ENCOUNTER — Telehealth: Payer: Self-pay

## 2016-11-11 NOTE — Telephone Encounter (Signed)
Tried calling patient earlier, no answer, left message to call me back

## 2016-11-11 NOTE — Telephone Encounter (Signed)
Tried calling patient, no answer, left message to return my call at [phone]

## 2016-11-11 NOTE — Telephone Encounter (Signed)
-----   Message from Regina Eck, CNM sent at 11/08/2016  7:44 PM EDT ----- Notify patient that vitamin D is improved to 46.2, recommend now 2000 IU daily of Vitamin D 3 OTC to maintain and recheck at [facility]

## 2016-11-12 NOTE — Telephone Encounter (Signed)
Return call to Joy. °

## 2016-11-12 NOTE — Telephone Encounter (Signed)
Patient notified of results as written by provider 

## 2016-11-12 NOTE — Telephone Encounter (Signed)
Left message for call back.

## 2017-04-17 ENCOUNTER — Encounter: Payer: Self-pay | Admitting: Certified Nurse Midwife

## 2017-04-17 ENCOUNTER — Other Ambulatory Visit: Payer: Self-pay

## 2017-04-17 ENCOUNTER — Ambulatory Visit (INDEPENDENT_AMBULATORY_CARE_PROVIDER_SITE_OTHER): Payer: 59 | Admitting: Certified Nurse Midwife

## 2017-04-17 ENCOUNTER — Other Ambulatory Visit (HOSPITAL_COMMUNITY)
Admission: RE | Admit: 2017-04-17 | Discharge: 2017-04-17 | Disposition: A | Discharge status: Home / Self Care | Payer: 59 | Source: Ambulatory Visit | Attending: Certified Nurse Midwife | Admitting: Certified Nurse Midwife

## 2017-04-17 VITALS — BP 106/64 | HR 60 | Resp 16 | Ht 61.5 in | Wt 145.0 lb

## 2017-04-17 DIAGNOSIS — Z124 Encounter for screening for malignant neoplasm of cervix: Secondary | ICD-10-CM | POA: Diagnosis not present

## 2017-04-17 DIAGNOSIS — Z01419 Encounter for gynecological examination (general) (routine) without abnormal findings: Secondary | ICD-10-CM | POA: Diagnosis not present

## 2017-04-17 DIAGNOSIS — Z1211 Encounter for screening for malignant neoplasm of colon: Secondary | ICD-10-CM | POA: Diagnosis not present

## 2017-04-17 DIAGNOSIS — D259 Leiomyoma of uterus, unspecified: Secondary | ICD-10-CM | POA: Diagnosis not present

## 2017-04-17 NOTE — Progress Notes (Signed)
47 y.o. G2P2002 Married  African American Fe here for annual exam. Periods have changed, last period, was October or November, no period in December until 04/09/17. Stress with son in college,finances and marriage,but coping with life. Sees PCP Dr. Pandora Leiter yearly for medication management of hypertension and labs, all normal per patient. No other health issues today.    Patient's last menstrual period was 04/09/2017 (exact date).          Sexually active: Yes.    The current method of family planning is tubal ligation.    Exercising: Yes.    body exercises Smoker:  no  Health Maintenance: Pap:  02-23-15 neg HPV HR neg History of Abnormal Pap: no MMG:  09-19-16 category c density birads 1:neg Self Breast exams: no Colonoscopy: none BMD:  none TDaP:  2015 Shingles: no Pneumonia: no Hep C and HIV: HIV neg yrs ago Labs: with PCP   reports that  has never smoked. she has never used smokeless tobacco. She reports that she drinks alcohol. She reports that she does not use drugs.  Past Medical History:  Diagnosis Date  . Chest pain   . Fibroid 2009  . Herpes   . Hypertension   . Sickle cell trait [facility])     Past Surgical History:  Procedure Laterality Date  . HAMMER TOE SURGERY    . LAPAROSCOPIC TUBAL LIGATION  2009  . MYOMECTOMY  2009    Current Outpatient Medications  Medication Sig Dispense Refill  . atenolol-chlorthalidone (TENORETIC) 50-25 MG per tablet Take 1 tablet by mouth daily.  5  . Biotin w/ Vitamins C & E (HAIR/SKIN/NAILS PO) Take by mouth.     No current facility-administered medications for this visit.     Family History  Problem Relation Age of Onset  . Breast cancer Maternal Aunt   . Diabetes Mellitus I Mother   . Hypertension Mother   . Heart attack Mother 72  . COPD Mother   . Neuropathy Mother   . Diabetes Maternal Grandmother   . Hypertension Maternal Grandmother   . Hypertension Father   . Stomach cancer Sister   . Cancer Sister   . Stroke Sister 15        HTN  . Hypertension Sister   . Heart attack Maternal Grandfather     ROS:  Pertinent items are noted in HPI.  Otherwise, a comprehensive ROS was negative.  Exam:   BP 106/64   Pulse 60   Resp 16   Ht 5' 1.5" (1.562 m)   Wt 145 lb (65.8 kg)   LMP 04/09/2017 (Exact Date)   BMI 26.95 kg/m  Height: 5' 1.5" (156.2 cm) Ht Readings from Last 3 Encounters:  04/17/17 5' 1.5" (1.562 m)  08/30/16 5\' 1"  (1.549 m)  02/27/16 5' 1.75" (1.568 m)    General appearance: alert, cooperative and appears stated age Head: Normocephalic, without obvious abnormality, atraumatic Neck: no adenopathy, supple, symmetrical, trachea midline and thyroid normal to inspection and palpation Lungs: clear to auscultation bilaterally Breasts: normal appearance, no masses or tenderness, No nipple retraction or dimpling, No nipple discharge or bleeding, No axillary or supraclavicular adenopathy Heart: regular rate and rhythm Abdomen: soft, non-tender; no masses,  no organomegaly Extremities: extremities normal, atraumatic, no cyanosis or edema Skin: Skin color, texture, turgor normal. No rashes or lesions Lymph nodes: Cervical, supraclavicular, and axillary nodes normal. No abnormal inguinal nodes palpated Neurologic: Grossly normal   Pelvic: External genitalia:  no lesions  Urethra:  normal appearing urethra with no masses, tenderness or lesions              Bartholin's and Skene's: normal                 Vagina: normal appearing vagina with normal color and discharge, no lesions              Cervix: multiparous appearance, no cervical motion tenderness and no lesions              Pap taken: Yes.   Bimanual Exam:  Uterus:  normal size, contour, position, consistency, mobility, non-tender and nodular feel, known fibroid, no size change              Adnexa: normal adnexa and no mass, fullness, tenderness               Rectovaginal: Confirms               Anus:  normal sphincter tone, no  lesions  Chaperone present: yes  A:  Well Woman with normal exam  Contraception tubal ligation  Uterine fibroid history no size change  Menstrual cycle change, but last period normal  Hypertension/Vitamin D management with PCP  Colonoscopy due  P:   Reviewed health and wellness pertinent to exam  Discussed no size change and advise if notices changes.  Discussed perimenopause etiology and amenorrhea and cycle change expectations. Encouraged to keep menses calendar and if no menses in 3 months needs to advise. Given printed material regarding menopause.  Also discussed weight change, thyroid change and pituitary change can also cause cycles changes.  Questions addressed.  Continue follow up with PCP as indicated.  Discussed risks/benefits, patient desires referral. She will be called information.  Pap smear: yes  counseled on breast self exam, mammography screening, menopause, adequate intake of calcium and vitamin D, diet and exercise  return annually or prn  An After Visit Summary was printed and given to the patient.

## 2017-04-17 NOTE — Patient Instructions (Signed)
EXERCISE AND DIET:  We recommended that you start or continue a regular exercise program for good health. Regular exercise means any activity that makes your heart beat faster and makes you sweat.  We recommend exercising at least 30 minutes per day at least 3 days a week, preferably 4 or 5.  We also recommend a diet low in fat and sugar.  Inactivity, poor dietary choices and obesity can cause diabetes, heart attack, stroke, and kidney damage, among others.    ALCOHOL AND SMOKING:  Women should limit their alcohol intake to no more than 7 drinks/beers/glasses of wine (combined, not each!) per week. Moderation of alcohol intake to this level decreases your risk of breast cancer and liver damage. And of course, no recreational drugs are part of a healthy lifestyle.  And absolutely no smoking or even second hand smoke. Most people know smoking can cause heart and lung diseases, but did you know it also contributes to weakening of your bones? Aging of your skin?  Yellowing of your teeth and nails?  CALCIUM AND VITAMIN D:  Adequate intake of calcium and Vitamin D are recommended.  The recommendations for exact amounts of these supplements seem to change often, but generally speaking 600 mg of calcium (either carbonate or citrate) and 800 units of Vitamin D per day seems prudent. Certain women may benefit from higher intake of Vitamin D.  If you are among these women, your doctor will have told you during your visit.    PAP SMEARS:  Pap smears, to check for cervical cancer or precancers,  have traditionally been done yearly, although recent scientific advances have shown that most women can have pap smears less often.  However, every woman still should have a physical exam from her gynecologist every year. It will include a breast check, inspection of the vulva and vagina to check for abnormal growths or skin changes, a visual exam of the cervix, and then an exam to evaluate the size and shape of the uterus and  ovaries.  And after 48 years of age, a rectal exam is indicated to check for rectal cancers. We will also provide age appropriate advice regarding health maintenance, like when you should have certain vaccines, screening for sexually transmitted diseases, bone density testing, colonoscopy, mammograms, etc.   MAMMOGRAMS:  All women over 40 years old should have a yearly mammogram. Many facilities now offer a "3D" mammogram, which may cost around $50 extra out of pocket. If possible,  we recommend you accept the option to have the 3D mammogram performed.  It both reduces the number of women who will be called back for extra views which then turn out to be normal, and it is better than the routine mammogram at detecting truly abnormal areas.    COLONOSCOPY:  Colonoscopy to screen for colon cancer is recommended for all women at age 50.  We know, you hate the idea of the prep.  We agree, BUT, having colon cancer and not knowing it is worse!!  Colon cancer so often starts as a polyp that can be seen and removed at colonscopy, which can quite literally save your life!  And if your first colonoscopy is normal and you have no family history of colon cancer, most women don't have to have it again for 10 years.  Once every ten years, you can do something that may end up saving your life, right?  We will be happy to help you get it scheduled when you are ready.    Be sure to check your insurance coverage so you understand how much it will cost.  It may be covered as a preventative service at no cost, but you should check your particular policy.     Perimenopause Perimenopause is the time when your body begins to move into the menopause (no menstrual period for 12 straight months). It is a natural process. Perimenopause can begin 2-8 years before the menopause and usually lasts for 1 year after the menopause. During this time, your ovaries may or may not produce an egg. The ovaries vary in their production of estrogen and  progesterone hormones each month. This can cause irregular menstrual periods, difficulty getting pregnant, vaginal bleeding between periods, and uncomfortable symptoms. What are the causes?  Irregular production of the ovarian hormones, estrogen and progesterone, and not ovulating every month. Other causes include:  Tumor of the pituitary gland in the brain.  Medical disease that affects the ovaries.  Radiation treatment.  Chemotherapy.  Unknown causes.  Heavy smoking and excessive alcohol intake can bring on perimenopause sooner.  What are the signs or symptoms?  Hot flashes.  Night sweats.  Irregular menstrual periods.  Decreased sex drive.  Vaginal dryness.  Headaches.  Mood swings.  Depression.  Memory problems.  Irritability.  Tiredness.  Weight gain.  Trouble getting pregnant.  The beginning of losing bone cells (osteoporosis).  The beginning of hardening of the arteries (atherosclerosis). How is this diagnosed? Your health care provider will make a diagnosis by analyzing your age, menstrual history, and symptoms. He or she will do a physical exam and note any changes in your body, especially your female organs. Female hormone tests may or may not be helpful depending on the amount of female hormones you produce and when you produce them. However, other hormone tests may be helpful to rule out other problems. How is this treated? In some cases, no treatment is needed. The decision on whether treatment is necessary during the perimenopause should be made by you and your health care provider based on how the symptoms are affecting you and your lifestyle. Various treatments are available, such as:  Treating individual symptoms with a specific medicine for that symptom.  Herbal medicines that can help specific symptoms.  Counseling.  Group therapy.  Follow these instructions at home:  Keep track of your menstrual periods (when they occur, how heavy  they are, how long between periods, and how long they last) as well as your symptoms and when they started.  Only take over-the-counter or prescription medicines as directed by your health care provider.  Sleep and rest.  Exercise.  Eat a diet that contains calcium (good for your bones) and soy (acts like the estrogen hormone).  Do not smoke.  Avoid alcoholic beverages.  Take vitamin supplements as recommended by your health care provider. Taking vitamin E may help in certain cases.  Take calcium and vitamin D supplements to help prevent bone loss.  Group therapy is sometimes helpful.  Acupuncture may help in some cases. Contact a health care provider if:  You have questions about any symptoms you are having.  You need a referral to a specialist (gynecologist, psychiatrist, or psychologist). Get help right away if:  You have vaginal bleeding.  Your period lasts longer than 8 days.  Your periods are recurring sooner than 21 days.  You have bleeding after intercourse.  You have severe depression.  You have pain when you urinate.  You have severe headaches.  You have vision   problems. This information is not intended to replace advice given to you by your health care provider. Make sure you discuss any questions you have with your health care provider. Document Released: 05/02/2004 Document Revised: 08/31/2015 Document Reviewed: 10/22/2012 [employer] Interactive Patient Education  2017 [employer]  

## 2017-04-21 LAB — CYTOLOGY - PAP
Diagnosis: NEGATIVE
Diagnosis: REACTIVE
HPV (WINDOPATH): NOT DETECTED

## 2017-05-08 DIAGNOSIS — Z1211 Encounter for screening for malignant neoplasm of colon: Secondary | ICD-10-CM | POA: Diagnosis not present

## 2017-05-08 DIAGNOSIS — Z800 Family history of malignant neoplasm of digestive organs: Secondary | ICD-10-CM | POA: Diagnosis not present

## 2017-07-04 DIAGNOSIS — Z1211 Encounter for screening for malignant neoplasm of colon: Secondary | ICD-10-CM | POA: Diagnosis not present

## 2017-07-04 DIAGNOSIS — Z800 Family history of malignant neoplasm of digestive organs: Secondary | ICD-10-CM | POA: Diagnosis not present

## 2017-08-18 DIAGNOSIS — I10 Essential (primary) hypertension: Secondary | ICD-10-CM | POA: Diagnosis not present

## 2017-09-08 ENCOUNTER — Other Ambulatory Visit: Payer: Self-pay | Admitting: Family Medicine

## 2017-09-08 DIAGNOSIS — Z1231 Encounter for screening mammogram for malignant neoplasm of breast: Secondary | ICD-10-CM

## 2017-09-29 ENCOUNTER — Ambulatory Visit
Admission: RE | Admit: 2017-09-29 | Discharge: 2017-09-29 | Disposition: A | Discharge status: Home / Self Care | Payer: 59 | Source: Ambulatory Visit | Attending: Family Medicine | Admitting: Family Medicine

## 2017-09-29 DIAGNOSIS — Z1231 Encounter for screening mammogram for malignant neoplasm of breast: Secondary | ICD-10-CM

## 2018-02-24 DIAGNOSIS — Z23 Encounter for immunization: Secondary | ICD-10-CM | POA: Diagnosis not present

## 2018-02-24 DIAGNOSIS — I10 Essential (primary) hypertension: Secondary | ICD-10-CM | POA: Diagnosis not present

## 2018-02-24 DIAGNOSIS — Z0000 Encounter for general adult medical examination without abnormal findings: Secondary | ICD-10-CM | POA: Diagnosis not present

## 2018-04-24 ENCOUNTER — Ambulatory Visit: Payer: 59 | Admitting: Certified Nurse Midwife

## 2018-04-24 ENCOUNTER — Encounter: Payer: Self-pay | Admitting: Certified Nurse Midwife

## 2018-07-02 ENCOUNTER — Ambulatory Visit: Payer: 59 | Admitting: Certified Nurse Midwife

## 2018-07-21 DIAGNOSIS — R0789 Other chest pain: Secondary | ICD-10-CM | POA: Diagnosis not present

## 2018-07-21 DIAGNOSIS — I10 Essential (primary) hypertension: Secondary | ICD-10-CM | POA: Diagnosis not present

## 2018-09-11 ENCOUNTER — Other Ambulatory Visit: Payer: Self-pay

## 2018-09-14 ENCOUNTER — Other Ambulatory Visit: Payer: Self-pay

## 2018-09-14 ENCOUNTER — Other Ambulatory Visit (HOSPITAL_COMMUNITY)
Admission: RE | Admit: 2018-09-14 | Discharge: 2018-09-14 | Disposition: A | Discharge status: Home / Self Care | Payer: 59 | Source: Ambulatory Visit | Attending: Certified Nurse Midwife | Admitting: Certified Nurse Midwife

## 2018-09-14 ENCOUNTER — Ambulatory Visit: Payer: 59 | Admitting: Certified Nurse Midwife

## 2018-09-14 ENCOUNTER — Telehealth: Payer: Self-pay | Admitting: *Deleted

## 2018-09-14 ENCOUNTER — Encounter: Payer: Self-pay | Admitting: Certified Nurse Midwife

## 2018-09-14 VITALS — BP 120/80 | HR 68 | Temp 97.6°F | Resp 16 | Ht 61.0 in | Wt 154.0 lb

## 2018-09-14 DIAGNOSIS — Z124 Encounter for screening for malignant neoplasm of cervix: Secondary | ICD-10-CM | POA: Diagnosis not present

## 2018-09-14 DIAGNOSIS — N912 Amenorrhea, unspecified: Secondary | ICD-10-CM | POA: Diagnosis not present

## 2018-09-14 DIAGNOSIS — N852 Hypertrophy of uterus: Secondary | ICD-10-CM

## 2018-09-14 DIAGNOSIS — D259 Leiomyoma of uterus, unspecified: Secondary | ICD-10-CM

## 2018-09-14 DIAGNOSIS — N950 Postmenopausal bleeding: Secondary | ICD-10-CM

## 2018-09-14 DIAGNOSIS — Z01419 Encounter for gynecological examination (general) (routine) without abnormal findings: Secondary | ICD-10-CM | POA: Diagnosis present

## 2018-09-14 DIAGNOSIS — Z01411 Encounter for gynecological examination (general) (routine) with abnormal findings: Secondary | ICD-10-CM | POA: Diagnosis not present

## 2018-09-14 NOTE — Progress Notes (Signed)
48 y.o. G2P2002 Married  African American Fe here for annual exam. Periods none since 04/09/17. Occasional hot flashes only. Occasional spotting with sexual activity only. No vaginal dryness that she is aware of. Currently having spotting with light red color today after intercourse only. Has noted some thickness of blood with this ? Skin appearance. No cramping or pain with occurrence. Patient had history of fibroids with myomectomy in 2009. Stress in life has improved now. Stopped hypertension medication 3/20, all staying normal with eating well and exercising. Sees PCP for aex, labs and hypertension management. No other health concerns today.   Patient's last menstrual period was 04/09/2017 (exact date).          Sexually active: Yes.    The current method of family planning is tubal ligation.    Exercising: Yes.    walking 6 miles Smoker:  no  Review of Systems  Constitutional: Negative.   HENT: Negative.   Eyes: Negative.   Respiratory: Negative.   Cardiovascular: Negative.   Gastrointestinal: Negative.   Genitourinary: Negative.   Musculoskeletal: Negative.   Skin: Negative.   Neurological: Negative.   Endo/Heme/Allergies: Negative.   Psychiatric/Behavioral: Negative.     Health Maintenance: Pap:  04-17-17 neg HPV HR neg, 02-23-15 neg HPV HR neg History of Abnormal Pap: no MMG:  09-29-17 category c denisty birads 1:neg Self Breast exams: yes Colonoscopy:  2019 normal f/u 86yrs per patient BMD:  none TDaP:  2015 Shingles: no Pneumonia: no Hep C and HIV: HIV neg per patient, hep c unsure but believes it was checked Labs: if needed   reports that she has never smoked. She has never used smokeless tobacco. She reports current alcohol use. She reports that she does not use drugs.  Past Medical History:  Diagnosis Date  . Chest pain   . Fibroid 2009  . Herpes   . Hypertension   . Sickle cell trait [facility])     Past Surgical History:  Procedure Laterality Date  . HAMMER TOE  SURGERY    . LAPAROSCOPIC TUBAL LIGATION  2009  . MYOMECTOMY  2009    Current Outpatient Medications  Medication Sig Dispense Refill  . aspirin 325 MG tablet Take 325 mg by mouth daily.    . Biotin w/ Vitamins C & E (HAIR/SKIN/NAILS PO) Take by mouth.    Marland Kitchen UNABLE TO FIND Herbal water pill    . UNABLE TO FIND Blood pressure factor    . UNABLE TO FIND Apple cider vinegar gummy     No current facility-administered medications for this visit.     Family History  Problem Relation Age of Onset  . Breast cancer Maternal Aunt   . Diabetes Mellitus I Mother   . Hypertension Mother   . Heart attack Mother 27  . COPD Mother   . Neuropathy Mother   . Diabetes Maternal Grandmother   . Hypertension Maternal Grandmother   . Hypertension Father   . Stomach cancer Sister   . Cancer Sister   . Stroke Sister 34       HTN  . Hypertension Sister   . Heart attack Maternal Grandfather     ROS:  Pertinent items are noted in HPI.  Otherwise, a comprehensive ROS was negative.  Exam:   BP 120/80   Pulse 68   Temp 97.6 F (36.4 C) (Skin)   Resp 16   LMP 04/09/2017 (Exact Date) Comment: occ spotting   Ht Readings from Last 3 Encounters:  04/17/17 5'  1.5" (1.562 m)  08/30/16 5\' 1"  (1.549 m)  02/27/16 5' 1.75" (1.568 m)    General appearance: alert, cooperative and appears stated age Head: Normocephalic, without obvious abnormality, atraumatic Neck: no adenopathy, supple, symmetrical, trachea midline and thyroid no nodules noted Lungs: clear to auscultation bilaterally Breasts: normal appearance, no masses or tenderness, No nipple retraction or dimpling, No nipple discharge or bleeding, No axillary or supraclavicular adenopathy Heart: regular rate and rhythm Abdomen: soft, non-tender; no masses,  no organomegaly Extremities: extremities normal, atraumatic, no cyanosis or edema Skin: Skin color, texture, turgor normal. No rashes or lesions Lymph nodes: Cervical, supraclavicular, and  axillary nodes normal. No abnormal inguinal nodes palpated Neurologic: Grossly normal   Pelvic: External genitalia:  no lesions              Urethra:  normal appearing urethra with no masses, tenderness or lesions              Bartholin's and Skene's: normal                 Vagina: normal appearing vagina with normal color and discharge, no lesions              Cervix: multiparous appearance, no cervical motion tenderness, no lesions and mucosy brown red discharge noted, no tissue appearance, pap taken              Pap taken: Yes.   Bimanual Exam:  Uterus:  enlarged, 12  weeks size, non tender              Adnexa: no mass, fullness, tenderness and slight nodular on left               Rectovaginal: Confirms               Anus:  normal sphincter tone, no lesions  Chaperone present: yes  A:  Well Woman with normal exam  Contraception BTL  Enlarged uterus, previous history of fibroids with myomectomy 2009  Post menopausal bleeding suspected  Hypertension with PCP management, no medication now  P:   Reviewed health and wellness pertinent to exam  Discussed uterine finding and vaginal finding of brown blood finding and need for evaluation with PUS and possible Endometrial biopsy with MD. Patient agreeable to scheduling. Questions addressed. Patient will be called with insurance information and scheduled.  Discussed need to verify menopause with lab [facility] and make sure no thyroid or pituitary issues which may have stopped her period instead of menopause. Questions addressed. Agreeable to labs. Lab: TSH,FSH, Prolactin  Continue follow up with PCP as indicated.  Pap smear: yes   counseled on breast self exam, mammography screening, adequate intake of calcium and vitamin D, diet and exercise  return annually or prn  An After Visit Summary was printed and given to the patient.

## 2018-09-14 NOTE — Telephone Encounter (Signed)
Left message to call Sharee Pimple, RN at [facility].    Patient seen in office today by Melvia Heaps, CNM. Needs PUS with possible EMB with MD.   Contraception BTL Enlarged uterus, previous history of fibroids with myomectomy 2009 Post menopausal bleeding suspected

## 2018-09-14 NOTE — Telephone Encounter (Signed)
Spoke with patient. Scheduled for PUS and possible EMB on 09/17/18 at 9am, consult to follow at 9:30 am with Dr. Quincy Simmonds. Advised to take Motrin 800 mg with food and water one hour before procedure. Orders placed for precert. Advised Dr. Quincy Simmonds will review, our office will return call if any additional recommendations. Patient verbalizes understanding.    Routing to provider for final review. Patient is agreeable to disposition. Will close encounter.  Cc: Melvia Heaps, CNM, Lerry Liner

## 2018-09-15 LAB — CYTOLOGY - PAP: Diagnosis: NEGATIVE

## 2018-09-15 LAB — FOLLICLE STIMULATING HORMONE: FSH: 59.7 m[IU]/mL

## 2018-09-15 LAB — TSH: TSH: 1.32 u[IU]/mL (ref 0.450–4.500)

## 2018-09-15 LAB — PROLACTIN: Prolactin: 18.1 ng/mL (ref 4.8–23.3)

## 2018-09-17 ENCOUNTER — Ambulatory Visit (INDEPENDENT_AMBULATORY_CARE_PROVIDER_SITE_OTHER): Payer: 59

## 2018-09-17 ENCOUNTER — Encounter: Payer: Self-pay | Admitting: Obstetrics and Gynecology

## 2018-09-17 ENCOUNTER — Other Ambulatory Visit: Payer: Self-pay

## 2018-09-17 ENCOUNTER — Ambulatory Visit: Payer: 59 | Admitting: Obstetrics and Gynecology

## 2018-09-17 VITALS — BP 116/72 | HR 76 | Temp 97.6°F | Resp 14 | Ht 61.0 in | Wt 154.6 lb

## 2018-09-17 DIAGNOSIS — D259 Leiomyoma of uterus, unspecified: Secondary | ICD-10-CM

## 2018-09-17 DIAGNOSIS — D219 Benign neoplasm of connective and other soft tissue, unspecified: Secondary | ICD-10-CM | POA: Diagnosis not present

## 2018-09-17 DIAGNOSIS — N930 Postcoital and contact bleeding: Secondary | ICD-10-CM | POA: Diagnosis not present

## 2018-09-17 DIAGNOSIS — N950 Postmenopausal bleeding: Secondary | ICD-10-CM

## 2018-09-17 DIAGNOSIS — N852 Hypertrophy of uterus: Secondary | ICD-10-CM

## 2018-09-17 NOTE — Progress Notes (Signed)
Encounter reviewed by Dr. Elmo Rio Amundson C. Silva.  

## 2018-09-17 NOTE — Progress Notes (Signed)
GYNECOLOGY  VISIT   HPI: 48 y.o.   Married  [place] American  female   [phone] with Patient's last menstrual period was 04/16/2017.   here for ultrasound and possible EMB.  Patient seen by Evalee Mutton for annual exam on 09/14/18. She had bleeding and has not had a menstrual period since 04/09/17.  She reported occasional spotting with intercourse at that time.  She does states that she has a little spotting every now and then, but is most often after having sex.  She states she has spotting at least every month.  It is more random in nature. She notices "pieces of meat" that pass after intercourse.  It is a pale piece of tissue the size of a nickel most of the time.  Denies pain with intercourse.  Stable partner for 20 years.   She has occasional RLQ pain, similar to when she had her fibroids requiring myomectomy years ago.  This is a very short lived pain, not associated with anything.   She does have urinary frequency.   Status post BTL.  Status post myomectomy 20 years ago.   FHS 59.7 on 09/14/18.  Prolactin and TSH normal.  GYNECOLOGIC HISTORY: Patient's last menstrual period was 04/16/2017. Contraception:  Tubal ligation Menopausal hormone therapy:  none Last mammogram:   09-29-17 category c density birads 1:neg Last pap smear:   09/14/18 - normal.  04-17-17 neg HPV HR neg, 02-23-15 neg HPV HR neg        OB History    Gravida  2   Para  2   Term  2   Preterm      AB      Living  2     SAB      TAB      Ectopic      Multiple      Live Births  2              Patient Active Problem List   Diagnosis Date Noted  . Hypertension   . Sickle cell trait ([place])   . Herpes     Past Medical History:  Diagnosis Date  . Chest pain   . Fibroid 2009  . Herpes   . Hypertension   . Sickle cell trait [facility])     Past Surgical History:  Procedure Laterality Date  . HAMMER TOE SURGERY    . LAPAROSCOPIC TUBAL LIGATION  2009  . MYOMECTOMY  2009    Current  Outpatient Medications  Medication Sig Dispense Refill  . aspirin 325 MG tablet Take 325 mg by mouth daily.    . Biotin w/ Vitamins C & E (HAIR/SKIN/NAILS PO) Take by mouth.    Marland Kitchen UNABLE TO FIND Herbal water pill    . UNABLE TO FIND Blood pressure factor    . UNABLE TO FIND Apple cider vinegar gummy     No current facility-administered medications for this visit.      ALLERGIES: Patient has no known allergies.  Family History  Problem Relation Age of Onset  . Breast cancer Maternal Aunt   . Diabetes Mellitus I Mother   . Hypertension Mother   . Heart attack Mother 90  . COPD Mother   . Neuropathy Mother   . Diabetes Maternal Grandmother   . Hypertension Maternal Grandmother   . Hypertension Father   . Stomach cancer Sister   . Cancer Sister   . Stroke Sister 41       HTN  .  Hypertension Sister   . Heart attack Maternal Grandfather     Social History   Socioeconomic History  . Marital status: Married    Spouse name: Not on file  . Number of children: Not on file  . Years of education: Not on file  . Highest education level: Not on file  Occupational History  . Not on file  Social Needs  . Financial resource strain: Not on file  . Food insecurity    Worry: Not on file    Inability: Not on file  . Transportation needs    Medical: Not on file    Non-medical: Not on file  Tobacco Use  . Smoking status: Never Smoker  . Smokeless tobacco: Never Used  Substance and Sexual Activity  . Alcohol use: Yes    Alcohol/week: 0.0 - 2.0 standard drinks  . Drug use: No  . Sexual activity: Yes    Partners: Male    Birth control/protection: Surgical    Comment: BTL  Lifestyle  . Physical activity    Days per week: Not on file    Minutes per session: Not on file  . Stress: Not on file  Relationships  . Social Herbalist on phone: Not on file    Gets together: Not on file    Attends religious service: Not on file    Active member of club or organization: Not  on file    Attends meetings of clubs or organizations: Not on file    Relationship status: Not on file  . Intimate partner violence    Fear of current or ex partner: Not on file    Emotionally abused: Not on file    Physically abused: Not on file    Forced sexual activity: Not on file  Other Topics Concern  . Not on file  Social History Narrative  . Not on file    Review of Systems  Constitutional: Negative.   HENT: Negative.   Eyes: Negative.   Respiratory: Negative.   Cardiovascular: Negative.   Gastrointestinal: Negative.   Endocrine: Negative.   Genitourinary: Negative.   Musculoskeletal: Negative.   Skin: Negative.   Allergic/Immunologic: Negative.   Neurological: Negative.   Hematological: Negative.   Psychiatric/Behavioral: Negative.     PHYSICAL EXAMINATION:    BP 116/72 (BP Location: Left Arm, Patient Position: Sitting, Cuff Size: Large)   Pulse 76   Temp 97.6 F (36.4 C) (Temporal)   Resp 14   Wt 154 lb 9.6 oz (70.1 kg)   LMP 04/16/2017   BMI 28.74 kg/m     General appearance: alert, cooperative and appears stated age  Pelvic: External genitalia:  no lesions              Urethra:  normal appearing urethra with no masses, tenderness or lesions              Bartholins and Skenes: normal                 Vagina: normal appearing vagina with normal color and discharge, no lesions              Cervix: no lesions                Bimanual Exam:  Uterus:  normal size, contour, position, consistency, mobility, non-tender              Adnexa: no mass, fullness, tenderness  Chaperone was present for exam.  Pelvic US - multiple fibroids - largest 19 mm.  EMS 3.79 mm.  Small right CL.  Left adenexal cystic structure consistent with hydrosalpinx.   Endometrial biopsy.  Consent for procedure.  Sterile prep with Hibiclens. Paracervical block with 10 cc 1% lidocaine - lot [id number], exp 10/07/19. Tenaculum to anterior cervical lip. Pipelle passed to 7.5  cm twice.  Tissue to pathology. Minimal EBL.  No complications.   ASSESSMENT  Postmenopausal bleeding by [facility] level but evidence of corpus luteum on ultrasound. Uterine fibroids.  Postcoital bleeding.  Normal pap.  Left hydrosalpinx. Status post BTL and status post myomectomy.  RLQ pain - sporadic.  Uncertain etiology.  PLAN  We discussed postmenopausal bleeding, post coital bleeding, fibroids, and left hydrosalpinx.  Will follow up EMB.  Final plan to follow.  She may benefit from vaginal estrogen therapy.   An After Visit Summary was printed and given to the patient.  __15____ minutes face to face time of which over 50% was spent in counseling.

## 2018-09-17 NOTE — Patient Instructions (Signed)

## 2018-09-21 ENCOUNTER — Telehealth: Payer: Self-pay

## 2018-09-21 NOTE — Telephone Encounter (Signed)
Spoke with patient and reviewed results of EMB. Patient states she will call to schedule MMG but she doesn't think she wants vaginal estrogen cream. Explained reasoning for Estrogen cream, patient states she will have to think about it. Routed to provider

## 2018-09-21 NOTE — Telephone Encounter (Signed)
-----   Message from Nunzio Cobbs, MD sent at 09/21/2018 12:23 PM EDT ----- Please report results of endometrial biopsy which show benign inactive endometrium.  No abnormal cells were seen.  If she would like treatment for her postcoital bleeding, she may receive a prescription for vaginal estrogens.  I suspect that atrophy of the vagina is causing this to occur.  She just needs to update her mammogram for this.

## 2018-09-21 NOTE — Telephone Encounter (Signed)
Encounter reviewed and closed.  

## 2018-09-22 ENCOUNTER — Other Ambulatory Visit: Payer: Self-pay | Admitting: Family Medicine

## 2018-09-22 DIAGNOSIS — Z1231 Encounter for screening mammogram for malignant neoplasm of breast: Secondary | ICD-10-CM

## 2018-11-06 ENCOUNTER — Other Ambulatory Visit: Payer: Self-pay

## 2018-11-06 ENCOUNTER — Ambulatory Visit
Admission: RE | Admit: 2018-11-06 | Discharge: 2018-11-06 | Disposition: A | Discharge status: Home / Self Care | Payer: 59 | Source: Ambulatory Visit | Attending: Family Medicine | Admitting: Family Medicine

## 2018-11-06 DIAGNOSIS — Z1231 Encounter for screening mammogram for malignant neoplasm of breast: Secondary | ICD-10-CM

## 2019-01-28 ENCOUNTER — Ambulatory Visit: Payer: 59 | Admitting: Podiatry

## 2019-01-28 ENCOUNTER — Ambulatory Visit (INDEPENDENT_AMBULATORY_CARE_PROVIDER_SITE_OTHER): Payer: 59

## 2019-01-28 ENCOUNTER — Other Ambulatory Visit: Payer: Self-pay

## 2019-01-28 ENCOUNTER — Encounter: Payer: Self-pay | Admitting: Podiatry

## 2019-01-28 DIAGNOSIS — M2042 Other hammer toe(s) (acquired), left foot: Secondary | ICD-10-CM

## 2019-01-28 DIAGNOSIS — M2041 Other hammer toe(s) (acquired), right foot: Secondary | ICD-10-CM | POA: Diagnosis not present

## 2019-01-28 DIAGNOSIS — L84 Corns and callosities: Secondary | ICD-10-CM | POA: Diagnosis not present

## 2019-01-28 DIAGNOSIS — M216X9 Other acquired deformities of unspecified foot: Secondary | ICD-10-CM

## 2019-01-28 NOTE — Progress Notes (Signed)
   Subjective:    Patient ID: Tiffany Davila, female    DOB: April 06, 1971, 48 y.o.   MRN: [id number]  HPI    Review of Systems  All other systems reviewed and are negative.      Objective:   Physical Exam        Assessment & Plan:

## 2019-02-02 ENCOUNTER — Telehealth: Payer: Self-pay | Admitting: Podiatry

## 2019-02-02 NOTE — Telephone Encounter (Signed)
I'm scheduled to have surgery on Tuesday 03/02/2019. I want to push that to the week of Christmas, like 03/30/2019. I rescheduled patients surgery to 03/30/2019 on Dr. Mellody Drown schedule in [place] and I also rescheduled her appointment to come in and sign her consent forms to Friday 03/19/2019 at 12:15 pm. Pt asked about the time. I told her I was unable to give her a time, that someone from the surgical center always calls a day or two prior to let the pt know what time to arrive for surgery. I told her it depends on if they have any changes in their schedules or any patients that are diabetic or pediatric that have to go back first.   I contacted Caren Griffins at [facility] and told her patients new surgery date.

## 2019-02-03 NOTE — Progress Notes (Signed)
Subjective:   Patient ID: Tiffany Davila, female   DOB: 48 y.o.   MRN: [id number]   HPI Patient presents stating she has had problems with the left second digit and it is become increasingly hard for her to wear shoe gear without difficulty.  States it is gotten worse and the joint also hurts and makes it difficult to walk and it is been getting worse and she wants something done to fix this problem.  Patient does not smoke likes to be active   Review of Systems  All other systems reviewed and are negative.       Objective:  Physical Exam Vitals signs and nursing note reviewed.  Constitutional:      Appearance: She is well-developed.  Pulmonary:     Effort: Pulmonary effort is normal.  Musculoskeletal: Normal range of motion.  Skin:    General: Skin is warm.  Neurological:     Mental Status: She is alert.     Neurovascular status intact muscle strength adequate range of motion within normal limits with patient found to have inflammation second MPJ left with elevation of the toe and what appears to be medial and dorsal dislocation of the joint.  Good digital perfusion well oriented x3     Assessment:  Mobility for flexor plate injury with capsulitis inflammation of the joint surface secondary to the position of the toe     Plan:  H&P education rendered and I did discuss different treatment options and she wants the toe straightened and I recommended digital fusion with probable metatarsal osteotomy.  Patient will be seen back in 1 week and today all education given to patient  X-ray indicates that there is rotation of the second toe with slight elongation second metatarsal with no indications of arthritis stress fracture

## 2019-02-19 ENCOUNTER — Ambulatory Visit: Payer: 59 | Admitting: Podiatry

## 2019-03-01 ENCOUNTER — Encounter: Payer: Self-pay | Admitting: Obstetrics and Gynecology

## 2019-03-08 ENCOUNTER — Encounter: Payer: 59 | Admitting: Podiatry

## 2019-03-16 ENCOUNTER — Telehealth: Payer: Self-pay | Admitting: Podiatry

## 2019-03-16 NOTE — Telephone Encounter (Addendum)
DOS: 03/30/2019  SURGICAL PROCEDURES: Metatarsal Osteotomy 2nd BMBO(48592), Exc. Benign Lesion Over 4.0 cm NGFR(43200), Hammertoe Repair 2nd VLDK(44619)  [employer] Effective 04/08/2018 - 04/08/2019  Deductible is $500 with $0 met and $500 remaining. Out of Pocket is $5,000 with $156.72 met and $4,843.28 remaining.  1 [id number] Correction, hammertoe (eg, interphalange more Covered/Approved 03/16/2019  2 11426 Excision, benign lesion including margin more Covered/Approved 03/16/2019  3 28308 Osteotomy, with or without lengthening, more           Covered/Approved 03/16/2019  Authorization# [id number] was covered/approved on 03/16/2019.

## 2019-03-19 ENCOUNTER — Encounter: Payer: Self-pay | Admitting: Podiatry

## 2019-03-19 ENCOUNTER — Other Ambulatory Visit: Payer: Self-pay

## 2019-03-19 ENCOUNTER — Ambulatory Visit: Payer: 59 | Admitting: Podiatry

## 2019-03-19 DIAGNOSIS — M216X9 Other acquired deformities of unspecified foot: Secondary | ICD-10-CM

## 2019-03-19 DIAGNOSIS — L84 Corns and callosities: Secondary | ICD-10-CM | POA: Diagnosis not present

## 2019-03-19 DIAGNOSIS — M216X2 Other acquired deformities of left foot: Secondary | ICD-10-CM

## 2019-03-19 NOTE — Patient Instructions (Signed)
Pre-Operative Instructions  Congratulations, you have decided to take an important step towards improving your quality of life.  You can be assured that the doctors and staff at [facility] will be with you every step of the way.  Here are some important things you should know:  1. Plan to be at the surgery center/hospital at least 1 (one) hour prior to your scheduled time, unless otherwise directed by the surgical center/hospital staff.  You must have a responsible adult accompany you, remain during the surgery and drive you home.  Make sure you have directions to the surgical center/hospital to ensure you arrive on time. 2. If you are having surgery at [facility] or [facility] hospitals, you will need a copy of your medical history and physical form from your family physician within one month prior to the date of surgery. We will give you a form for your primary physician to complete.  3. We make every effort to accommodate the date you request for surgery.  However, there are times where surgery dates or times have to be moved.  We will contact you as soon as possible if a change in schedule is required.   4. No aspirin/ibuprofen for one week before surgery.  If you are on aspirin, any non-steroidal anti-inflammatory medications (Mobic, Aleve, Ibuprofen) should not be taken seven (7) days prior to your surgery.  You make take Tylenol for pain prior to surgery.  5. Medications - If you are taking daily heart and blood pressure medications, seizure, reflux, allergy, asthma, anxiety, pain or diabetes medications, make sure you notify the surgery center/hospital before the day of surgery so they can tell you which medications you should take or avoid the day of surgery. 6. No food or drink after midnight the night before surgery unless directed otherwise by surgical center/hospital staff. 7. No alcoholic beverages 24-hours prior to surgery.  No smoking 24-hours prior or 24-hours after  surgery. 8. Wear loose pants or shorts. They should be loose enough to fit over bandages, boots, and casts. 9. Don't wear slip-on shoes. Sneakers are preferred. 10. Bring your boot with you to the surgery center/hospital.  Also bring crutches or a walker if your physician has prescribed it for you.  If you do not have this equipment, it will be provided for you after surgery. 11. If you have not been contacted by the surgery center/hospital by the day before your surgery, call to confirm the date and time of your surgery. 12. Leave-time from work may vary depending on the type of surgery you have.  Appropriate arrangements should be made prior to surgery with your employer. 13. Prescriptions will be provided immediately following surgery by your doctor.  Fill these as soon as possible after surgery and take the medication as directed. Pain medications will not be refilled on weekends and must be approved by the doctor. 14. Remove nail polish on the operative foot and avoid getting pedicures prior to surgery. 15. Wash the night before surgery.  The night before surgery wash the foot and leg well with water and the antibacterial soap provided. Be sure to pay special attention to beneath the toenails and in between the toes.  Wash for at least three (3) minutes. Rinse thoroughly with water and dry well with a towel.  Perform this wash unless told not to do so by your physician.  Enclosed: 1 Ice pack (please put in freezer the night before surgery)   1 Hibiclens skin cleaner     Pre-op instructions  If you have any questions regarding the instructions, please do not hesitate to call our office.  [place]: [address], [place], [place] [address] -- [phone]  [place]: [address] [place], [place] [address] -- [phone]  [place]: [address], [place], [place] [address] -- [phone]   Website: [email or web address] 

## 2019-03-22 ENCOUNTER — Encounter: Payer: 59 | Admitting: Podiatry

## 2019-03-22 NOTE — Progress Notes (Signed)
Subjective:   Patient ID: Tiffany Davila, female   DOB: 48 y.o.   MRN: [id number]   HPI Patient presents for consent form concerning correction of chronic deformity of the left foot with patient found to have hammertoe deformity lesion formation of a chronic nature that she is tried numerous things to get resolved    ROS      Objective:  Physical Exam  Neurovascular status found to be intact muscle strength was found to be adequate with severe keratotic lesion subsecond metatarsal left that is deep and painful when palpated with digital deformity pressing down on the area     Assessment:  Chronic lesion formation secondary to bone structure with hammertoe deformity second left     Plan:  H&P reviewed condition and recommended digital fusion along with metatarsal osteotomy and excision of the lesion.  Patient wants surgery and I explained this to her and we reviewed what would be required and all possible complications as listed.  Patient is willing to accept risk of procedure and at this point after extensive review signed consent form and is given all preoperative instructions along with understanding of alternative treatments.  I went ahead and explained in great detail there is no guarantee that this will resolve the lesion and it could recur or transfer.  Patient understands total recovery will take 6 months to 1 year and I did dispense a food to use in the postoperative period at this time with instructions on usage and encouraged her to call with any questions concerns prior to procedure

## 2019-03-29 ENCOUNTER — Telehealth: Payer: Self-pay | Admitting: *Deleted

## 2019-03-29 MED ORDER — HYDROCODONE-ACETAMINOPHEN 10-325 MG PO TABS: 1.0000 | ORAL_TABLET | Freq: Four times a day (QID) | ORAL | 0 refills | Status: AC | PRN

## 2019-03-29 NOTE — Addendum Note (Signed)
Addended by: Wallene Huh on: 03/29/2019 06:06 PM   Modules accepted: Orders

## 2019-03-29 NOTE — Telephone Encounter (Signed)
Pt called states she has some questions prior to her surgery tomorrow, and would like to know where to get the scrub brush to use prior to surgery

## 2019-03-29 NOTE — Telephone Encounter (Signed)
Pt called saying she doesn't have what she needs to clean her foot with tonight before surgery in the morning. I asked if she had a little bag with the surgical center brochure, ice pack, and cleaning brush? Pt stated she didn't and I told her to come by the office to pick it up. Pt also stated when she came in for her sx consult appt Dr. Paulla Dolly stated there was a chance the hammertoe could come back and she was under the impression he was just shaving the bone but not correcting it. I told her he is doing a hammertoe repair which would straighten the bone out and she would probably have a k-wire in the toe. Pt asked me to pass this message along because she wants to make sure both she and Dr. Paulla Dolly are on the same page before her surgery in the morning.

## 2019-03-29 NOTE — Telephone Encounter (Signed)
Always more difficult when doing surgery on bone that has been worked on before. Should work out fine for her

## 2019-03-29 NOTE — Telephone Encounter (Signed)
Left voicemail letting pt know what Dr. Paulla Dolly said. Told her to call with any other questions.

## 2019-03-30 DIAGNOSIS — D492 Neoplasm of unspecified behavior of bone, soft tissue, and skin: Secondary | ICD-10-CM

## 2019-03-30 DIAGNOSIS — M2042 Other hammer toe(s) (acquired), left foot: Secondary | ICD-10-CM

## 2019-03-30 DIAGNOSIS — M21542 Acquired clubfoot, left foot: Secondary | ICD-10-CM

## 2019-03-31 ENCOUNTER — Telehealth: Payer: Self-pay

## 2019-03-31 NOTE — Telephone Encounter (Signed)
Tried Calling pt Regarding Post op. Pt did not call back.  Left a voice message to see how she was doing.

## 2019-04-05 ENCOUNTER — Other Ambulatory Visit: Payer: Self-pay

## 2019-04-05 ENCOUNTER — Encounter: Payer: Self-pay | Admitting: Podiatry

## 2019-04-05 ENCOUNTER — Ambulatory Visit (INDEPENDENT_AMBULATORY_CARE_PROVIDER_SITE_OTHER): Payer: 59

## 2019-04-05 ENCOUNTER — Ambulatory Visit (INDEPENDENT_AMBULATORY_CARE_PROVIDER_SITE_OTHER): Payer: 59 | Admitting: Podiatry

## 2019-04-05 DIAGNOSIS — M2041 Other hammer toe(s) (acquired), right foot: Secondary | ICD-10-CM

## 2019-04-05 DIAGNOSIS — M2042 Other hammer toe(s) (acquired), left foot: Secondary | ICD-10-CM

## 2019-04-06 NOTE — Progress Notes (Signed)
Subjective:   Patient ID: Tiffany Davila, female   DOB: 48 y.o.   MRN: [id number]   HPI Patient presents stating she is doing well with minimal discomfort and so far she is very pleased   ROS      Objective:  Physical Exam  Neurovascular status intact negative Bevelyn Buckles' sign noted second toe in good alignment pin in place wound edges well coapted second metatarsal plantar second metatarsal      Assessment:  Doing well post foot surgery     Plan:  H&P done x-ray evaluated and reapplied sterile dressing after review.  Patient will continue immobilization elevation compression reappoint 2 weeks suture removal or earlier if needed  X-ray indicates osteotomies healing well second digit in good alignment I was unable to bring the 2 edges of the bone together due to previous surgery but the positional component of the toe looks good with fixation and I am hoping it will heal with fibrous union

## 2019-04-07 ENCOUNTER — Other Ambulatory Visit: Payer: Self-pay

## 2019-04-07 ENCOUNTER — Ambulatory Visit (INDEPENDENT_AMBULATORY_CARE_PROVIDER_SITE_OTHER): Payer: 59

## 2019-04-07 ENCOUNTER — Encounter: Payer: Self-pay | Admitting: Podiatry

## 2019-04-07 ENCOUNTER — Ambulatory Visit (INDEPENDENT_AMBULATORY_CARE_PROVIDER_SITE_OTHER): Payer: 59 | Admitting: Podiatry

## 2019-04-07 DIAGNOSIS — M2042 Other hammer toe(s) (acquired), left foot: Secondary | ICD-10-CM

## 2019-04-07 DIAGNOSIS — Z9889 Other specified postprocedural states: Secondary | ICD-10-CM

## 2019-04-07 DIAGNOSIS — M2041 Other hammer toe(s) (acquired), right foot: Secondary | ICD-10-CM

## 2019-04-07 NOTE — Progress Notes (Signed)
Subjective:   Patient ID: Tiffany Davila, female   DOB: 48 y.o.   MRN: [id number]   HPI Patient presents stating that she traumatized her left second toe and she is concerned she may have done something to either it or to the pin and she wants to have it checked   ROS      Objective:  Physical Exam  Neurovascular status intact negative Bevelyn Buckles' sign noted with patient having rotation of the pin second digit left secondary to trauma     Assessment:  Trauma second digit left with possibility of damage to the underlying structure after surgery     Plan:  Precautionary x-ray reviewed and reapplied sterile dressing and advised that no further damage was sustained  X-rays indicate that the pin is in place the toe continues to be in the same alignment in the metatarsal same alignment with screw in place

## 2019-04-19 ENCOUNTER — Encounter: Payer: Self-pay | Admitting: Podiatry

## 2019-04-19 ENCOUNTER — Ambulatory Visit (INDEPENDENT_AMBULATORY_CARE_PROVIDER_SITE_OTHER): Payer: 59

## 2019-04-19 ENCOUNTER — Other Ambulatory Visit: Payer: Self-pay

## 2019-04-19 ENCOUNTER — Encounter: Payer: 59 | Admitting: Podiatry

## 2019-04-19 ENCOUNTER — Ambulatory Visit (INDEPENDENT_AMBULATORY_CARE_PROVIDER_SITE_OTHER): Payer: 59 | Admitting: Podiatry

## 2019-04-19 DIAGNOSIS — M2041 Other hammer toe(s) (acquired), right foot: Secondary | ICD-10-CM

## 2019-04-19 DIAGNOSIS — M79672 Pain in left foot: Secondary | ICD-10-CM | POA: Diagnosis not present

## 2019-04-19 DIAGNOSIS — M2042 Other hammer toe(s) (acquired), left foot: Secondary | ICD-10-CM

## 2019-04-21 ENCOUNTER — Telehealth: Payer: Self-pay | Admitting: Podiatry

## 2019-04-21 NOTE — Progress Notes (Signed)
Subjective:   Patient ID: Jeris Penta, female   DOB: 49 y.o.   MRN: [id number]   HPI Patient states she is doing well with her left foot and is pleased and the pin remains intact and the toe in good position   ROS      Objective:  Physical Exam  Neuro vascular status intact negative Bevelyn Buckles' sign noted with patient's left foot healing well wound edges well coapted with the digit in good alignment pin intact and stitches intact plantar     Assessment:  Overall doing well forefoot reconstruction left     Plan:  H&P x-ray reviewed and I then went ahead and I removed stitches plantarly and I dispensed a compression fascial brace to both provide for compression through the ankle and also to lower the second toe with instructions on how to use it correctly.  Patient will be seen back 2 weeks for pin removal or earlier if needed  X-rays indicate that the toe is in good alignment pin is intact screw intact

## 2019-04-21 NOTE — Telephone Encounter (Signed)
Patient would like to be released to return to work on 04/26/2019.

## 2019-04-22 NOTE — Telephone Encounter (Signed)
This is Dr. Mellody Drown patient. I am forwarding this to him. Thanks

## 2019-04-22 NOTE — Telephone Encounter (Signed)
Please advise. Tiffany Davila

## 2019-04-22 NOTE — Telephone Encounter (Signed)
She is fine to return to work

## 2019-04-22 NOTE — Telephone Encounter (Signed)
I informed pt Dr. Paulla Dolly had okayed for her to return to work 04/26/2019. Pt requested the note also include she be able to wear a athletic shoe on the right foot and the surgery shoe on the left foot, until she was able to transition to an athletic shoe on the left and then transition to a regular shoe as her surgery foot recovery allows. Pt request the note be emailed to [email or web address].

## 2019-04-23 NOTE — Telephone Encounter (Signed)
Do I need to type the note or just e-mail it? Just wanting to confirm. Thanks.

## 2019-04-23 NOTE — Telephone Encounter (Signed)
Thank you ma'am ! ?

## 2019-04-30 ENCOUNTER — Telehealth: Payer: Self-pay | Admitting: Podiatry

## 2019-04-30 NOTE — Telephone Encounter (Signed)
I spoke with patient and she was able to return to work on Monday 04/26/2019, now she wants to know if she can have Intermittent FMLA, since she needs to leave work early to come to her appt. "I'm not sure how many more times, Dr. Paulla Dolly will need to see me." "I'm just going by, what my job says, so I will need intermittent FMLA". Please advice as what to do?

## 2019-05-03 ENCOUNTER — Encounter: Payer: Self-pay | Admitting: Podiatry

## 2019-05-03 ENCOUNTER — Ambulatory Visit (INDEPENDENT_AMBULATORY_CARE_PROVIDER_SITE_OTHER): Payer: 59 | Admitting: Podiatry

## 2019-05-03 ENCOUNTER — Other Ambulatory Visit: Payer: Self-pay

## 2019-05-03 ENCOUNTER — Ambulatory Visit (INDEPENDENT_AMBULATORY_CARE_PROVIDER_SITE_OTHER): Payer: 59

## 2019-05-03 VITALS — Temp 97.1°F

## 2019-05-03 DIAGNOSIS — M2012 Hallux valgus (acquired), left foot: Secondary | ICD-10-CM

## 2019-05-05 ENCOUNTER — Encounter: Payer: 59 | Admitting: Podiatry

## 2019-05-06 NOTE — Progress Notes (Signed)
Subjective:   Patient ID: Tiffany Davila, female   DOB: 49 y.o.   MRN: [id number]   HPI Patient states so far doing really well with foot ready to get pin out but overall is feeling good about the position of everything and how she is doing   ROS      Objective:  Physical Exam  Neurovascular status intact negative Bevelyn Buckles' sign noted digit in good alignment pin in place second digit wound edges well coapted dorsal second metatarsal     Assessment:  Doing well post digital procedure with pin and osteotomy second metatarsal along with plantar skin excision     Plan:  H&P condition reviewed and I recommended pin removal which was accomplished with sterile dressing.  Discussed keeping the toe in a plantarflexed position and continuing to use immobilization gradual increase in activity levels over the next few weeks and dispensed ankle compression stocking.  Reappoint 4 weeks or earlier if needed  X-rays indicate that the digits in good alignment fixation in place fixation plates metatarsal with no indications of reoccurrence lesion plantar left

## 2019-06-14 ENCOUNTER — Other Ambulatory Visit: Payer: Self-pay

## 2019-06-14 ENCOUNTER — Ambulatory Visit (INDEPENDENT_AMBULATORY_CARE_PROVIDER_SITE_OTHER): Payer: 59 | Admitting: Podiatry

## 2019-06-14 ENCOUNTER — Encounter: Payer: Self-pay | Admitting: Podiatry

## 2019-06-14 ENCOUNTER — Ambulatory Visit (INDEPENDENT_AMBULATORY_CARE_PROVIDER_SITE_OTHER): Payer: 59

## 2019-06-14 VITALS — Temp 97.5°F

## 2019-06-14 DIAGNOSIS — M2012 Hallux valgus (acquired), left foot: Secondary | ICD-10-CM

## 2019-06-17 NOTE — Progress Notes (Signed)
Subjective:   Patient ID: Tiffany Davila, female   DOB: 49 y.o.   MRN: [id number]   HPI States doing better but still having some discomfort and patient is wearing her shoe but feels like still at times she has pain she is very happy overall with how she is doing after the   ROS      Objective:  Physical Exam  Neurovascular status intact muscle strength found to be adequate negative Bevelyn Buckles' sign noted with wound edges healed well good alignment noted and toe soap on the second stain in good position     Assessment:  Doing well post foot surgery left with good alignment     Plan:  H&P x-rays reviewed and advised on the continuation of brace usage but reducing it slowly and beginning to wear soft shoe and compression stocking.  Reappoint to recheck in 6 weeks or earlier if needed  X-rays indicate good alignment no signs of pathology associated with positioning with screw in place

## 2019-06-30 ENCOUNTER — Encounter: Payer: Self-pay | Admitting: Certified Nurse Midwife

## 2019-07-23 ENCOUNTER — Telehealth: Payer: Self-pay | Admitting: Podiatry

## 2019-07-23 NOTE — Telephone Encounter (Signed)
I'm calling in reference to guarantor number [id number]. Will someone please give me a call back. Thank you. Bye.

## 2019-09-07 ENCOUNTER — Other Ambulatory Visit: Payer: Self-pay | Admitting: Family Medicine

## 2019-09-07 DIAGNOSIS — Z1231 Encounter for screening mammogram for malignant neoplasm of breast: Secondary | ICD-10-CM

## 2019-09-14 NOTE — Progress Notes (Deleted)
49 y.o. G46P2002 Married Black or [place] American Not Hispanic or Latino female here for annual exam.      No LMP recorded.          Sexually active: {yes no:314532}  The current method of family planning is {contraception:315051}.    Exercising: {yes no:314532}  {types:19826} Smoker:  {YES NO:22349}  Health Maintenance: Pap: 09/12/18 WNL, 04-17-17 neg HPV HR neg, 02-23-15 neg HPV HR neg History of abnormal Pap:  no MMG:  11/06/18 density c Bi-rads 1 neg  BMD:   None  Colonoscopy:2019 F/U 5 years TDaP: 02/06/14 Gardasil: NA   reports that she has never smoked. She has never used smokeless tobacco. She reports current alcohol use. She reports that she does not use drugs.  Past Medical History:  Diagnosis Date  . Chest pain   . Fibroid 2009  . Herpes   . Hypertension   . Sickle cell trait [facility])     Past Surgical History:  Procedure Laterality Date  . HAMMER TOE SURGERY    . LAPAROSCOPIC TUBAL LIGATION  2009  . MYOMECTOMY  2009    Current Outpatient Medications  Medication Sig Dispense Refill  . aspirin 325 MG tablet Take 325 mg by mouth daily.    Marland Kitchen atenolol-chlorthalidone (TENORETIC) 50-25 MG tablet Take 1 tablet by mouth daily.    . Biotin w/ Vitamins C & E (HAIR/SKIN/NAILS PO) Take by mouth.    Marland Kitchen ibuprofen (ADVIL) 800 MG tablet Take 800 mg by mouth every 6 (six) hours as needed.    Marland Kitchen UNABLE TO FIND Herbal water pill    . UNABLE TO FIND Blood pressure factor    . UNABLE TO FIND Apple cider vinegar gummy     No current facility-administered medications for this visit.    Family History  Problem Relation Age of Onset  . Breast cancer Maternal Aunt   . Diabetes Mellitus I Mother   . Hypertension Mother   . Heart attack Mother 58  . COPD Mother   . Neuropathy Mother   . Diabetes Maternal Grandmother   . Hypertension Maternal Grandmother   . Hypertension Father   . Stomach cancer Sister   . Cancer Sister   . Stroke Sister 83       HTN  . Hypertension Sister   .  Heart attack Maternal Grandfather     Review of Systems  Exam:   There were no vitals taken for this visit.  Weight change: @WEIGHTCHANGE @ Height:      Ht Readings from Last 3 Encounters:  09/17/18 5\' 1"  (1.549 m)  09/14/18 5\' 1"  (1.549 m)  04/17/17 5' 1.5" (1.562 m)    General appearance: alert, cooperative and appears stated age Head: Normocephalic, without obvious abnormality, atraumatic Neck: no adenopathy, supple, symmetrical, trachea midline and thyroid {CHL AMB PHY EX THYROID NORM DEFAULT:[phone]::"normal to inspection and palpation"} Lungs: clear to auscultation bilaterally Cardiovascular: regular rate and rhythm Breasts: {Exam; breast:13139::"normal appearance, no masses or tenderness"} Abdomen: soft, non-tender; non distended,  no masses,  no organomegaly Extremities: extremities normal, atraumatic, no cyanosis or edema Skin: Skin color, texture, turgor normal. No rashes or lesions Lymph nodes: Cervical, supraclavicular, and axillary nodes normal. No abnormal inguinal nodes palpated Neurologic: Grossly normal   Pelvic: External genitalia:  no lesions              Urethra:  normal appearing urethra with no masses, tenderness or lesions  Bartholins and Skenes: normal                 Vagina: normal appearing vagina with normal color and discharge, no lesions              Cervix: {CHL AMB PHY EX CERVIX NORM DEFAULT:[phone]::"no lesions"}               Bimanual Exam:  Uterus:  {CHL AMB PHY EX UTERUS NORM DEFAULT:[phone]::"normal size, contour, position, consistency, mobility, non-tender"}              Adnexa: {CHL AMB PHY EX ADNEXA NO MASS [phone]::"no mass, fullness, tenderness"}               Rectovaginal: Confirms               Anus:  normal sphincter tone, no lesions  *** chaperoned for the exam.  A:  Well Woman with normal exam  P:

## 2019-09-15 ENCOUNTER — Ambulatory Visit: Payer: 59 | Admitting: Obstetrics and Gynecology

## 2019-09-17 ENCOUNTER — Ambulatory Visit: Payer: 59 | Admitting: Certified Nurse Midwife

## 2019-11-08 ENCOUNTER — Ambulatory Visit: Payer: 59

## 2019-11-08 ENCOUNTER — Other Ambulatory Visit: Payer: Self-pay

## 2019-11-08 ENCOUNTER — Ambulatory Visit
Admission: RE | Admit: 2019-11-08 | Discharge: 2019-11-08 | Disposition: A | Discharge status: Home / Self Care | Payer: 59 | Source: Ambulatory Visit | Attending: Family Medicine | Admitting: Family Medicine

## 2019-11-08 DIAGNOSIS — Z1231 Encounter for screening mammogram for malignant neoplasm of breast: Secondary | ICD-10-CM

## 2019-11-15 ENCOUNTER — Encounter: Payer: Self-pay | Admitting: Obstetrics and Gynecology

## 2019-11-15 ENCOUNTER — Other Ambulatory Visit: Payer: Self-pay

## 2019-11-15 ENCOUNTER — Ambulatory Visit: Payer: 59 | Admitting: Obstetrics and Gynecology

## 2019-11-15 VITALS — BP 130/72 | HR 77 | Ht 61.75 in | Wt 152.0 lb

## 2019-11-15 DIAGNOSIS — Z01419 Encounter for gynecological examination (general) (routine) without abnormal findings: Secondary | ICD-10-CM

## 2019-11-15 NOTE — Patient Instructions (Signed)
EXERCISE AND DIET:  We recommended that you start or continue a regular exercise program for good health. Regular exercise means any activity that makes your heart beat faster and makes you sweat.  We recommend exercising at least 30 minutes per day at least 3 days a week, preferably 4 or 5.  We also recommend a diet low in fat and sugar.  Inactivity, poor dietary choices and obesity can cause diabetes, heart attack, stroke, and kidney damage, among others.    ALCOHOL AND SMOKING:  Women should limit their alcohol intake to no more than 7 drinks/beers/glasses of wine (combined, not each!) per week. Moderation of alcohol intake to this level decreases your risk of breast cancer and liver damage. And of course, no recreational drugs are part of a healthy lifestyle.  And absolutely no smoking or even second hand smoke. Most people know smoking can cause heart and lung diseases, but did you know it also contributes to weakening of your bones? Aging of your skin?  Yellowing of your teeth and nails?  CALCIUM AND VITAMIN D:  Adequate intake of calcium and Vitamin D are recommended.  The recommendations for exact amounts of these supplements seem to change often, but generally speaking 1,200 mg of calcium (between diet and supplement) and 800 units of Vitamin D per day seems prudent. Certain women may benefit from higher intake of Vitamin D.  If you are among these women, your doctor will have told you during your visit.    PAP SMEARS:  Pap smears, to check for cervical cancer or precancers,  have traditionally been done yearly, although recent scientific advances have shown that most women can have pap smears less often.  However, every woman still should have a physical exam from her gynecologist every year. It will include a breast check, inspection of the vulva and vagina to check for abnormal growths or skin changes, a visual exam of the cervix, and then an exam to evaluate the size and shape of the uterus and  ovaries.  And after 50 years of age, a rectal exam is indicated to check for rectal cancers. We will also provide age appropriate advice regarding health maintenance, like when you should have certain vaccines, screening for sexually transmitted diseases, bone density testing, colonoscopy, mammograms, etc.   MAMMOGRAMS:  All women over 40 years old should have a yearly mammogram. Many facilities now offer a "3D" mammogram, which may cost around $50 extra out of pocket. If possible,  we recommend you accept the option to have the 3D mammogram performed.  It both reduces the number of women who will be called back for extra views which then turn out to be normal, and it is better than the routine mammogram at detecting truly abnormal areas.    COLON CANCER SCREENING: Now recommend starting at age 45. At this time colonoscopy is not covered for routine screening until 50. There are take home tests that can be done between 45-49.   COLONOSCOPY:  Colonoscopy to screen for colon cancer is recommended for all women at age 50.  We know, you hate the idea of the prep.  We agree, BUT, having colon cancer and not knowing it is worse!!  Colon cancer so often starts as a polyp that can be seen and removed at colonscopy, which can quite literally save your life!  And if your first colonoscopy is normal and you have no family history of colon cancer, most women don't have to have it again for   10 years.  Once every ten years, you can do something that may end up saving your life, right?  We will be happy to help you get it scheduled when you are ready.  Be sure to check your insurance coverage so you understand how much it will cost.  It may be covered as a preventative service at no cost, but you should check your particular policy.      Breast Self-Awareness Breast self-awareness means being familiar with how your breasts look and feel. It involves checking your breasts regularly and reporting any changes to your  health care provider. Practicing breast self-awareness is important. A change in your breasts can be a sign of a serious medical problem. Being familiar with how your breasts look and feel allows you to find any problems early, when treatment is more likely to be successful. All women should practice breast self-awareness, including women who have had breast implants. How to do a breast self-exam One way to learn what is normal for your breasts and whether your breasts are changing is to do a breast self-exam. To do a breast self-exam: Look for Changes  1. Remove all the clothing above your waist. 2. Stand in front of a mirror in a room with good lighting. 3. Put your hands on your hips. 4. Push your hands firmly downward. 5. Compare your breasts in the mirror. Look for differences between them (asymmetry), such as: ? Differences in shape. ? Differences in size. ? Puckers, dips, and bumps in one breast and not the other. 6. Look at each breast for changes in your skin, such as: ? Redness. ? Scaly areas. 7. Look for changes in your nipples, such as: ? Discharge. ? Bleeding. ? Dimpling. ? Redness. ? A change in position. Feel for Changes Carefully feel your breasts for lumps and changes. It is best to do this while lying on your back on the floor and again while sitting or standing in the shower or tub with soapy water on your skin. Feel each breast in the following way:  Place the arm on the side of the breast you are examining above your head.  Feel your breast with the other hand.  Start in the nipple area and make  inch (2 cm) overlapping circles to feel your breast. Use the pads of your three middle fingers to do this. Apply light pressure, then medium pressure, then firm pressure. The light pressure will allow you to feel the tissue closest to the skin. The medium pressure will allow you to feel the tissue that is a little deeper. The firm pressure will allow you to feel the tissue  close to the ribs.  Continue the overlapping circles, moving downward over the breast until you feel your ribs below your breast.  Move one finger-width toward the center of the body. Continue to use the  inch (2 cm) overlapping circles to feel your breast as you move slowly up toward your collarbone.  Continue the up and down exam using all three pressures until you reach your armpit.  Write Down What You Find  Write down what is normal for each breast and any changes that you find. Keep a written record with breast changes or normal findings for each breast. By writing this information down, you do not need to depend only on memory for size, tenderness, or location. Write down where you are in your menstrual cycle, if you are still menstruating. If you are having trouble noticing differences   in your breasts, do not get discouraged. With time you will become more familiar with the variations in your breasts and more comfortable with the exam. How often should I examine my breasts? Examine your breasts every month. If you are breastfeeding, the best time to examine your breasts is after a feeding or after using a breast pump. If you menstruate, the best time to examine your breasts is 5-7 days after your period is over. During your period, your breasts are lumpier, and it may be more difficult to notice changes. When should I see my health care provider? See your health care provider if you notice:  A change in shape or size of your breasts or nipples.  A change in the skin of your breast or nipples, such as a reddened or scaly area.  Unusual discharge from your nipples.  A lump or thick area that was not there before.  Pain in your breasts.  Anything that concerns you.  

## 2019-11-15 NOTE — Progress Notes (Signed)
49 y.o. G46P2002 Married Black or [place] American Not Hispanic or Latino female here for annual exam.  No bleeding. Infrequently sexually active, no pain. No bowel or bladder c/o.   She had a negative evaluation for PMP bleeding in 6/20.  Daughter is currently in the hospital, inflamed small intestines. Thinks it is an infection, on antibiotics.    Patient's last menstrual period was 04/16/2017.          Sexually active: Yes.    The current method of family planning is status post hysterectomy.    Exercising: Yes.    Walking and abs 30 min a day  Smoker:  no  Health Maintenance: Pap:  09/14/18 WNL 04/17/17 WNL HPV Neg  History of abnormal Pap:  no MMG:  11/08/19 Density C Birads 1 neg  BMD:   None  Colonoscopy: 07/04/17 Normal return in 5 years  TDaP:  04/08/13 Gardasil: NA   reports that she has never smoked. She has never used smokeless tobacco. She reports current alcohol use. She reports that she does not use drugs. 2 bottles of wine a week. 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 Son is 50, recently moved back home (short term). Daughter is 44, moving to college this Wednesday.   Past Medical History:  Diagnosis Date  . Chest pain   . Fibroid 2009  . Herpes   . Hypertension   . Sickle cell trait [facility])     Past Surgical History:  Procedure Laterality Date  . HAMMER TOE SURGERY    . LAPAROSCOPIC TUBAL LIGATION  2009  . MYOMECTOMY  2009    Current Outpatient Medications  Medication Sig Dispense Refill  . aspirin 325 MG tablet Take 325 mg by mouth daily.    . Biotin w/ Vitamins C & E (HAIR/SKIN/NAILS PO) Take by mouth.    . Ginkgo Biloba (GINKOBA PO) Take by mouth.    Marland Kitchen ibuprofen (ADVIL) 800 MG tablet Take 800 mg by mouth every 6 (six) hours as needed.    Marland Kitchen UNABLE TO FIND Herbal water pill    . UNABLE TO FIND Blood pressure factor    . UNABLE TO FIND Apple cider vinegar gummy    . UNABLE TO FIND Immune herbal supplement.     No current facility-administered medications for  this visit.    Family History  Problem Relation Age of Onset  . Breast cancer Maternal Aunt   . Diabetes Mellitus I Mother   . Hypertension Mother   . Heart attack Mother 35  . COPD Mother   . Neuropathy Mother   . Diabetes Maternal Grandmother   . Hypertension Maternal Grandmother   . Hypertension Father   . Stomach cancer Sister   . Cancer Sister   . Stroke Sister 47       HTN  . Hypertension Sister   . Heart attack Maternal Grandfather     Review of Systems  All other systems reviewed and are negative.   Exam:   BP 130/72   Pulse 77   Ht 5' 1.75" (1.568 m)   Wt 152 lb (68.9 kg)   LMP 04/16/2017   SpO2 100%   BMI 28.03 kg/m   Weight change: @WEIGHTCHANGE @ Height:   Height: 5' 1.75" (156.8 cm)  Ht Readings from Last 3 Encounters:  11/15/19 5' 1.75" (1.568 m)  09/17/18 5\' 1"  (1.549 m)  09/14/18 5\' 1"  (1.549 m)    General appearance: alert, cooperative and appears stated age Head: Normocephalic, without obvious  abnormality, atraumatic Neck: no adenopathy, supple, symmetrical, trachea midline and thyroid normal to inspection and palpation Lungs: clear to auscultation bilaterally Cardiovascular: regular rate and rhythm Breasts: normal appearance, no masses or tenderness Abdomen: soft, non-tender; non distended,  no masses,  no organomegaly Extremities: extremities normal, atraumatic, no cyanosis or edema Skin: Skin color, texture, turgor normal. No rashes or lesions Lymph nodes: Cervical, supraclavicular, and axillary nodes normal. No abnormal inguinal nodes palpated Neurologic: Grossly normal   Pelvic: External genitalia:  no lesions              Urethra:  normal appearing urethra with no masses, tenderness or lesions              Bartholins and Skenes: normal                 Vagina: atrophic appearing vagina with normal color and discharge, no lesions              Cervix: no lesions               Bimanual Exam:  Uterus:  normal size, contour, position,  consistency, mobility, non-tender              Adnexa: no mass, fullness, tenderness               Rectovaginal: Confirms               Anus:  normal sphincter tone, no lesions  Gae Dry chaperoned for the exam.  A:  Well Woman with normal exam  P:   No pap this year  Mammogram just done   Colonoscopy UTD  Discussed breast self exam  Discussed calcium and vit D intake  Labs UTD with her primary

## 2020-02-04 ENCOUNTER — Ambulatory Visit: Payer: 59 | Admitting: Obstetrics and Gynecology

## 2020-07-06 ENCOUNTER — Telehealth: Payer: Self-pay

## 2020-07-11 ENCOUNTER — Telehealth: Payer: Self-pay | Admitting: Podiatry

## 2020-07-11 NOTE — Telephone Encounter (Signed)
Patient has asked that we email note to address on file once completed

## 2020-07-11 NOTE — Telephone Encounter (Signed)
Patient has requested new work note regarding comfortable shoes, stated the note was written last year for surgical foot. Patient stated she called in Friday requesting note but has yet to hear back, Please Advise

## 2020-07-12 NOTE — Telephone Encounter (Signed)
That is fine. She wants it emailed to work when completed

## 2020-10-05 ENCOUNTER — Other Ambulatory Visit: Payer: Self-pay | Admitting: Family Medicine

## 2020-10-05 DIAGNOSIS — Z1231 Encounter for screening mammogram for malignant neoplasm of breast: Secondary | ICD-10-CM

## 2020-11-20 ENCOUNTER — Ambulatory Visit: Payer: 59 | Admitting: Obstetrics and Gynecology

## 2020-11-20 NOTE — Progress Notes (Signed)
50 y.o. G60P2002 Married Black or [place] American Not Hispanic or Latino female here for annual exam.  No vaginal bleeding. She has tolerable vasomotor symptoms. No dyspareunia.  No bowel or bladder changes.     Patient's last menstrual period was 04/16/2017.          Sexually active: No.  The current method of family planning is post menopausal status.    Exercising: Yes.     Walking  Smoker:  no  Health Maintenance: Pap:   09/14/18 WNL, no hpv testing;  04/17/17 WNL HPV Neg  History of abnormal Pap:  no MMG:  11/10/19 density C Bi-rads 1 neg, scheduled next week.  BMD:   none  Colonoscopy: 07/04/17 Normal return in 5 years  TDaP:  2015  Gardasil: na   reports that she has never smoked. She has never used smokeless tobacco. She reports current alcohol use. She reports that she does not use drugs. 1 bottle of wine a week. 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 Son is 54, Daughter is 23  Past Medical History:  Diagnosis Date   Chest pain    Fibroid 2009   Herpes    Hypertension    Sickle cell trait ([place])     Past Surgical History:  Procedure Laterality Date   HAMMER TOE SURGERY     LAPAROSCOPIC TUBAL LIGATION  2009   MYOMECTOMY  2009    Current Outpatient Medications  Medication Sig Dispense Refill   ibuprofen (ADVIL) 800 MG tablet Take 800 mg by mouth every 6 (six) hours as needed.     atenolol-chlorthalidone (TENORETIC) 50-25 MG tablet Take 1 tablet by mouth daily.     No current facility-administered medications for this visit.    Family History  Problem Relation Age of Onset   Breast cancer Maternal Aunt    Diabetes Mellitus I Mother    Hypertension Mother    Heart attack Mother 24   COPD Mother    Neuropathy Mother    Diabetes Maternal Grandmother    Hypertension Maternal Grandmother    Hypertension Father    Stomach cancer Sister    Cancer Sister    Stroke Sister 27       HTN   Hypertension Sister    Heart attack Maternal Grandfather     Review of  Systems  All other systems reviewed and are negative.  Exam:   BP 110/64   Pulse (!) 59   Ht '5\' 1"'$  (1.549 m)   Wt 144 lb (65.3 kg)   LMP 04/16/2017   SpO2 97%   BMI 27.21 kg/m   Weight change: '@WEIGHTCHANGE'$ @ Height:   Height: '5\' 1"'$  (154.9 cm)  Ht Readings from Last 3 Encounters:  11/21/20 '5\' 1"'$  (1.549 m)  11/15/19 5' 1.75" (1.568 m)  09/17/18 '5\' 1"'$  (1.549 m)    General appearance: alert, cooperative and appears stated age Head: Normocephalic, without obvious abnormality, atraumatic Neck: no adenopathy, supple, symmetrical, trachea midline and thyroid normal to inspection and palpation Lungs: clear to auscultation bilaterally Cardiovascular: regular rate and rhythm Breasts: normal appearance, no masses or tenderness Abdomen: soft, non-tender; non distended,  no masses,  no organomegaly Extremities: extremities normal, atraumatic, no cyanosis or edema Skin: Skin color, texture, turgor normal. No rashes or lesions Lymph nodes: Cervical, supraclavicular, and axillary nodes normal. No abnormal inguinal nodes palpated Neurologic: Grossly normal   Pelvic: External genitalia:  no lesions              Urethra:  normal appearing urethra with no masses, tenderness or lesions              Bartholins and Skenes: normal                 Vagina: atrophic appearing vagina with normal color and discharge, no lesions              Cervix: no lesions               Bimanual Exam:  Uterus:  normal size, contour, position, consistency, mobility, non-tender and anteverted              Adnexa: no mass, fullness, tenderness               Rectovaginal: Confirms               Anus:  normal sphincter tone, no lesions  Gae Dry chaperoned for the exam.  1. Well woman exam No pap this year Mammogram scheduled Colonoscopy is UTD Labs with primary Discussed breast self exam Discussed calcium and vit D intake

## 2020-11-21 ENCOUNTER — Encounter: Payer: Self-pay | Admitting: Obstetrics and Gynecology

## 2020-11-21 ENCOUNTER — Ambulatory Visit (INDEPENDENT_AMBULATORY_CARE_PROVIDER_SITE_OTHER): Payer: 59 | Admitting: Obstetrics and Gynecology

## 2020-11-21 ENCOUNTER — Other Ambulatory Visit: Payer: Self-pay

## 2020-11-21 VITALS — BP 110/64 | HR 59 | Ht 61.0 in | Wt 144.0 lb

## 2020-11-21 DIAGNOSIS — Z01419 Encounter for gynecological examination (general) (routine) without abnormal findings: Secondary | ICD-10-CM | POA: Diagnosis not present

## 2020-11-21 NOTE — Patient Instructions (Signed)

## 2020-11-29 ENCOUNTER — Other Ambulatory Visit: Payer: Self-pay

## 2020-11-29 ENCOUNTER — Ambulatory Visit
Admission: RE | Admit: 2020-11-29 | Discharge: 2020-11-29 | Disposition: A | Discharge status: Home / Self Care | Payer: 59 | Source: Ambulatory Visit | Attending: Family Medicine | Admitting: Family Medicine

## 2020-11-29 DIAGNOSIS — Z1231 Encounter for screening mammogram for malignant neoplasm of breast: Secondary | ICD-10-CM

## 2021-11-14 NOTE — Progress Notes (Signed)
51 y.o. G81P2002 Married Black or [place] American Not Hispanic or Latino female here for annual exam.  No vaginal bleeding. Not sexually active, husband with ED.    No bowel or bladder c/o.   Patient's last menstrual period was 04/16/2017.          Sexually active: No.  The current method of family planning is post menopausal status.    Exercising: No.  The patient does not participate in regular exercise at present. Smoker:  no  Health Maintenance: Pap:   09/14/18 WNL, no hpv testing;  04/17/17 WNL HPV Neg  History of abnormal Pap:  no MMG:  12/01/20 Bi-rads 1 neg  BMD:   n/a Colonoscopy: 07/04/17 Normal return in 5 years  TDaP:  2015 Gardasil: n/a   reports that she has never smoked. She has never used smokeless tobacco. She reports current alcohol use. She reports that she does not use drugs. Just occasional ETOH. 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 Son is 43, Daughter is 83.  Past Medical History:  Diagnosis Date   Chest pain    Fibroid 2009   Herpes    Hypertension    Sickle cell trait ([place])   Rare mild HSV outbreaks on her buttocks, doesn't need valtrex  Past Surgical History:  Procedure Laterality Date   HAMMER TOE SURGERY     LAPAROSCOPIC TUBAL LIGATION  2009   MYOMECTOMY  2009    Current Outpatient Medications  Medication Sig Dispense Refill   Cholecalciferol (D3 PO) Take by mouth.     Omega-3 Fatty Acids (FISH OIL PO) Take by mouth.     No current facility-administered medications for this visit.    Family History  Problem Relation Age of Onset   Breast cancer Maternal Aunt    Diabetes Mellitus I Mother    Hypertension Mother    Heart attack Mother 73   COPD Mother    Neuropathy Mother    Diabetes Maternal Grandmother    Hypertension Maternal Grandmother    Hypertension Father    Stomach cancer Sister    Cancer Sister    Stroke Sister 59       HTN   Hypertension Sister    Heart attack Maternal Grandfather     Review of  Systems  All other systems reviewed and are negative.   Exam:   BP 130/82   Pulse 72   Ht '5\' 1"'$  (1.549 m)   Wt 152 lb (68.9 kg)   LMP 04/16/2017   SpO2 99%   BMI 28.72 kg/m   Weight change: '@WEIGHTCHANGE'$ @ Height:   Height: '5\' 1"'$  (154.9 cm)  Ht Readings from Last 3 Encounters:  11/22/21 '5\' 1"'$  (1.549 m)  11/21/20 '5\' 1"'$  (1.549 m)  11/15/19 5' 1.75" (1.568 m)    General appearance: alert, cooperative and appears stated age Head: Normocephalic, without obvious abnormality, atraumatic Neck: no adenopathy, supple, symmetrical, trachea midline and thyroid normal to inspection and palpation Lungs: clear to auscultation bilaterally Cardiovascular: regular rate and rhythm Breasts: normal appearance, no masses or tenderness Abdomen: soft, non-tender; non distended,  no masses,  no organomegaly Extremities: extremities normal, atraumatic, no cyanosis or edema Skin: Skin color, texture, turgor normal. No rashes or lesions Lymph nodes: Cervical, supraclavicular, and axillary nodes normal. No abnormal inguinal nodes palpated Neurologic: Grossly normal   Pelvic: External genitalia:  no lesions              Urethra:  normal appearing urethra  with no masses, tenderness or lesions              Bartholins and Skenes: normal                 Vagina: atrophic appearing vagina with normal color and discharge, no lesions              Cervix: no lesions               Bimanual Exam:  Uterus:  normal size, contour, position, consistency, mobility, non-tender              Adnexa: no mass, fullness, tenderness               Rectovaginal: Confirms               Anus:  normal sphincter tone, no lesions  Gae Dry, CMA chaperoned for the exam.  1. Well woman exam Discussed breast self exam Discussed calcium and vit D intake Mammogram due, she will schedule Colonoscopy due next spring with Dr Collene Mares, she will schedule Labs with primary  2. Screening for cervical cancer - Cytology - PAP

## 2021-11-22 ENCOUNTER — Ambulatory Visit (INDEPENDENT_AMBULATORY_CARE_PROVIDER_SITE_OTHER): Payer: 59 | Admitting: Obstetrics and Gynecology

## 2021-11-22 ENCOUNTER — Encounter: Payer: Self-pay | Admitting: Obstetrics and Gynecology

## 2021-11-22 ENCOUNTER — Other Ambulatory Visit (HOSPITAL_COMMUNITY)
Admission: RE | Admit: 2021-11-22 | Discharge: 2021-11-22 | Disposition: A | Discharge status: Home / Self Care | Payer: 59 | Source: Ambulatory Visit | Attending: Obstetrics and Gynecology | Admitting: Obstetrics and Gynecology

## 2021-11-22 VITALS — BP 130/82 | HR 72 | Ht 61.0 in | Wt 152.0 lb

## 2021-11-22 DIAGNOSIS — Z01419 Encounter for gynecological examination (general) (routine) without abnormal findings: Secondary | ICD-10-CM | POA: Diagnosis not present

## 2021-11-22 DIAGNOSIS — Z124 Encounter for screening for malignant neoplasm of cervix: Secondary | ICD-10-CM

## 2021-11-22 NOTE — Patient Instructions (Signed)

## 2021-11-27 LAB — CYTOLOGY - PAP
Comment: NEGATIVE
Diagnosis: UNDETERMINED — AB
High risk HPV: NEGATIVE

## 2021-12-06 ENCOUNTER — Other Ambulatory Visit: Payer: Self-pay | Admitting: Family Medicine

## 2021-12-06 DIAGNOSIS — Z1231 Encounter for screening mammogram for malignant neoplasm of breast: Secondary | ICD-10-CM

## 2021-12-24 IMAGING — MG MM DIGITAL SCREENING BILAT W/ TOMO AND CAD
8 series · 8 of 24 positions shown · non-contrast
Comparison: Previous exam(s).

CLINICAL DATA: Screening.

EXAM:
DIGITAL SCREENING BILATERAL MAMMOGRAM WITH TOMOSYNTHESIS AND CAD
TECHNIQUE: Bilateral screening digital craniocaudal and mediolateral oblique
mammograms were obtained. Bilateral screening digital breast
tomosynthesis was performed. The images were evaluated with
computer-aided detection.

[L CC synth-2D]
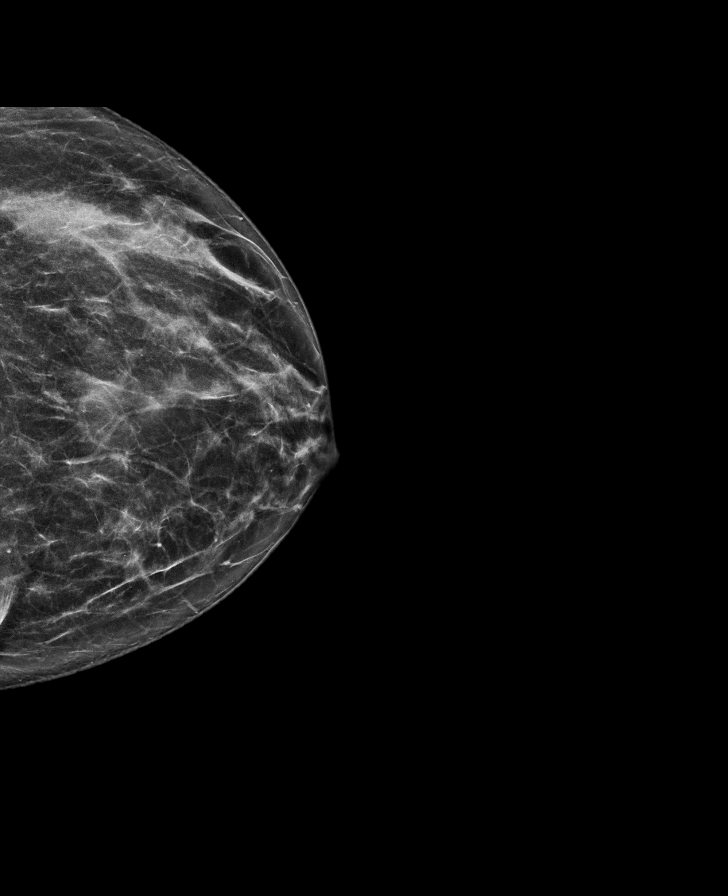

[L MLO synth-2D]
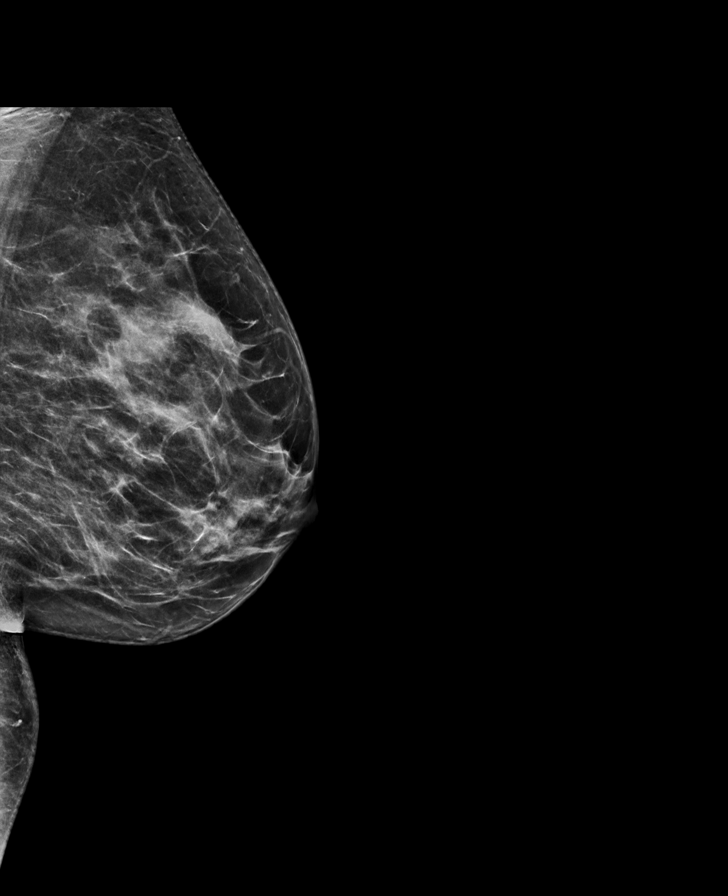

[R MLO synth-2D]
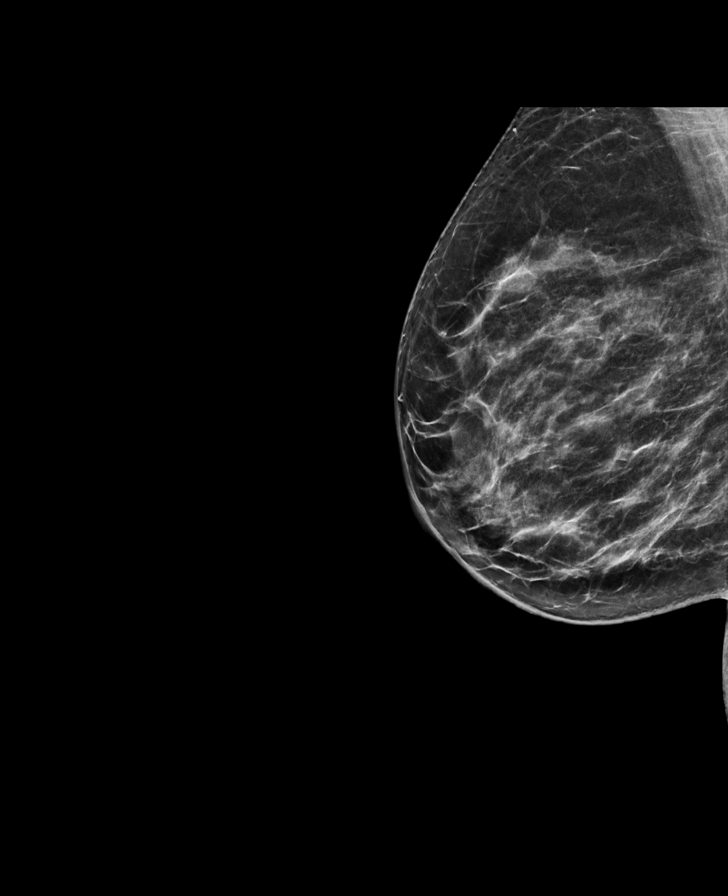

[R CC synth-2D]
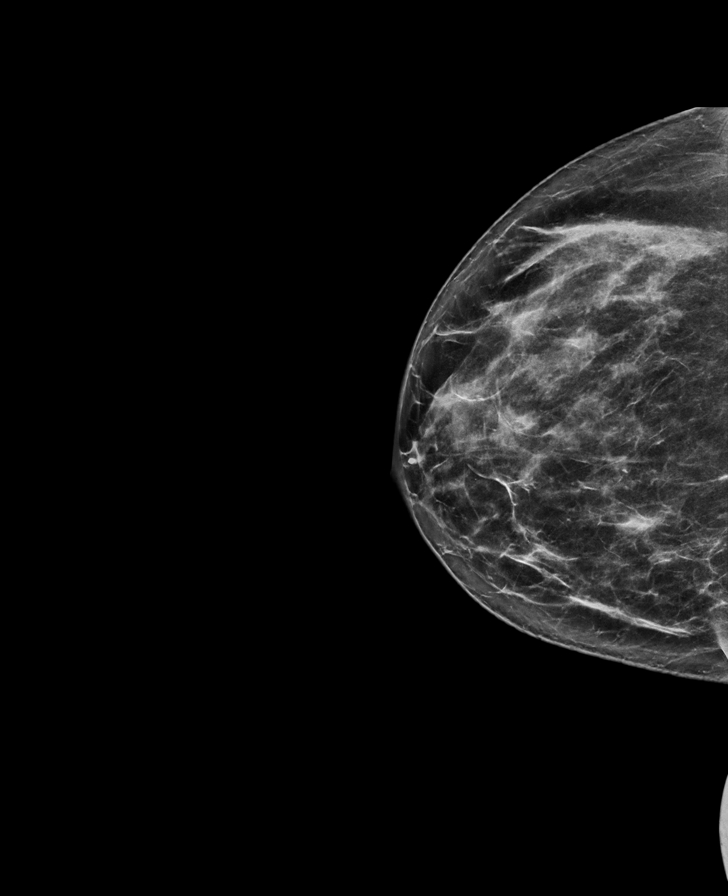

[R CC tomo · tomo slice 35/70.0]
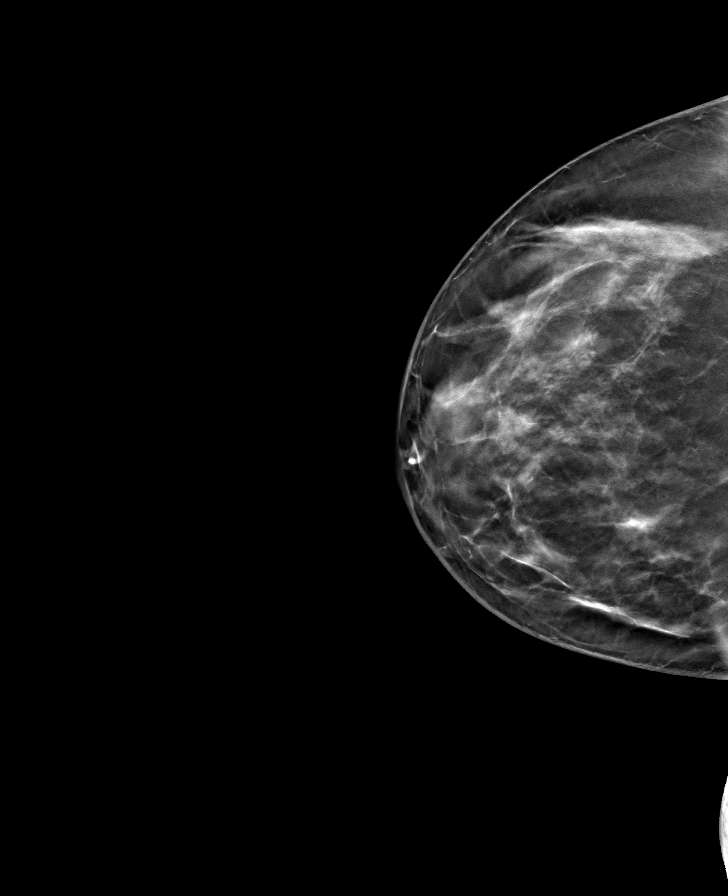

[R MLO tomo · tomo slice 35/70.0]
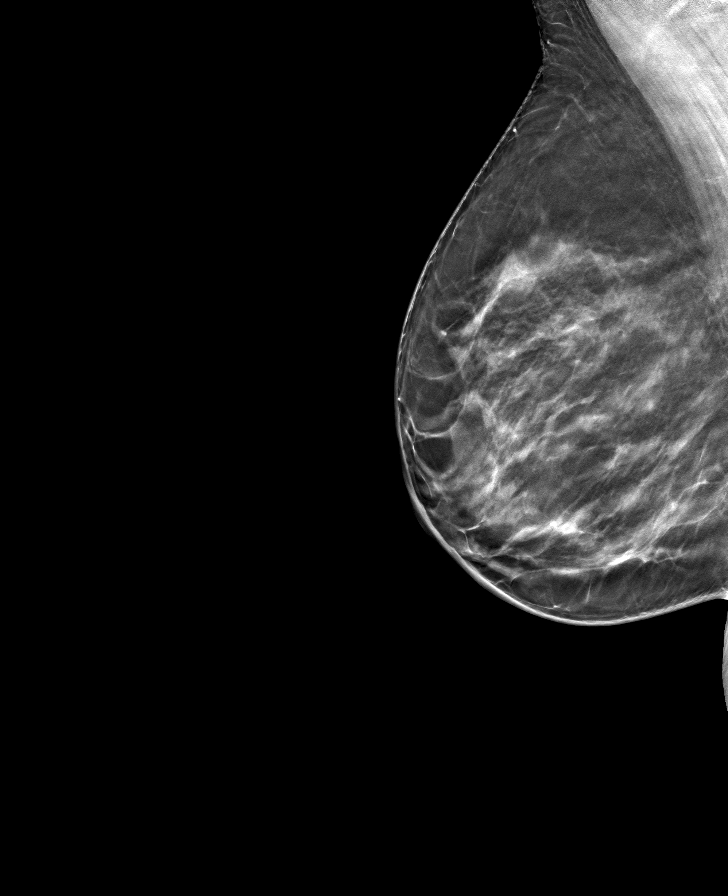

[L CC tomo · tomo slice 33/64.0]
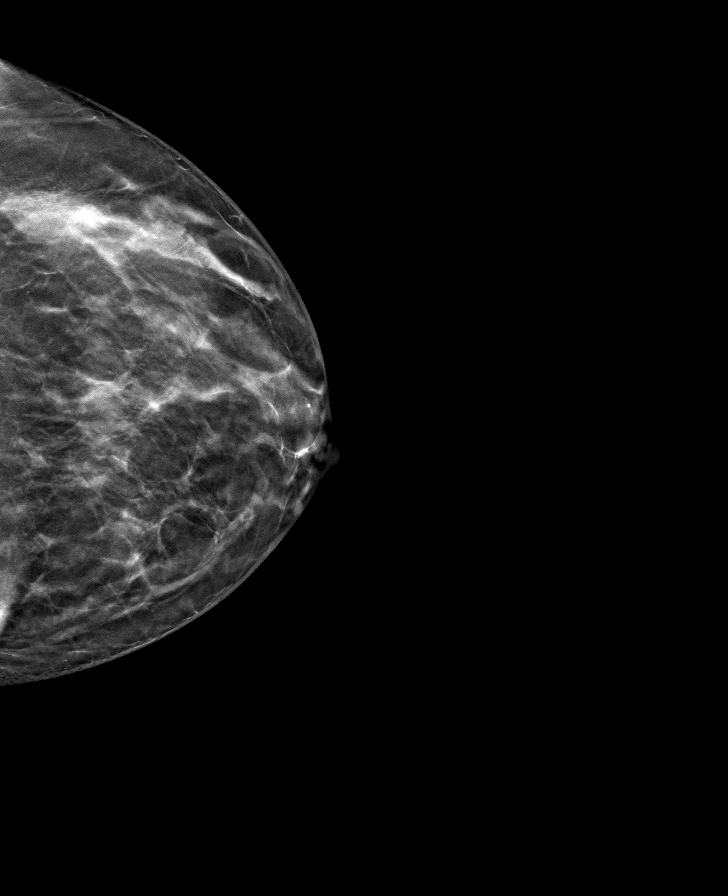

[L MLO tomo · tomo slice 35/68.0]
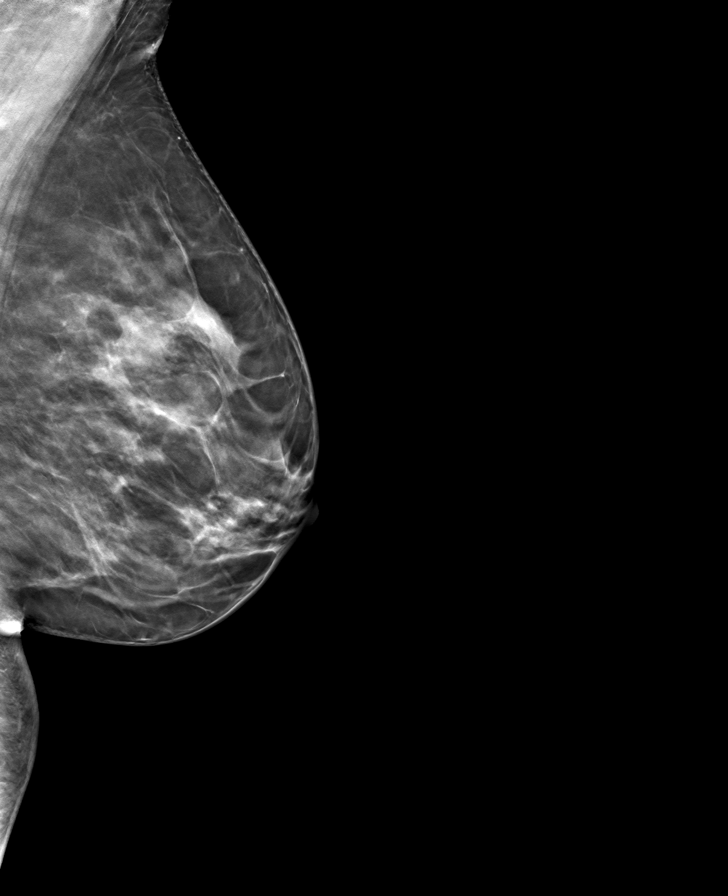

[8 of 24 positions shown; findings below may reference images not displayed]

ACR Breast Density Category c: The breast tissue is heterogeneously
dense, which may obscure small masses.
FINDINGS: There are no findings suspicious for malignancy.
IMPRESSION: No mammographic evidence of malignancy. A result letter of this
screening mammogram will be mailed directly to the patient.

RECOMMENDATION:
Screening mammogram in one year. (Code:Q3-W-BC3)

BI-RADS CATEGORY  1: Negative.

## 2022-01-02 ENCOUNTER — Ambulatory Visit
Admission: RE | Admit: 2022-01-02 | Discharge: 2022-01-02 | Disposition: A | Discharge status: Home / Self Care | Payer: 59 | Source: Ambulatory Visit | Attending: Family Medicine | Admitting: Family Medicine

## 2022-01-02 ENCOUNTER — Ambulatory Visit: Payer: 59

## 2022-01-02 DIAGNOSIS — Z1231 Encounter for screening mammogram for malignant neoplasm of breast: Secondary | ICD-10-CM

## 2022-09-13 LAB — HM COLONOSCOPY

## 2022-11-26 ENCOUNTER — Ambulatory Visit: Payer: 59 | Admitting: Obstetrics and Gynecology

## 2022-12-05 ENCOUNTER — Other Ambulatory Visit: Payer: Self-pay | Admitting: Family Medicine

## 2022-12-05 DIAGNOSIS — Z1231 Encounter for screening mammogram for malignant neoplasm of breast: Secondary | ICD-10-CM

## 2023-01-06 ENCOUNTER — Ambulatory Visit: Payer: 59

## 2023-01-09 ENCOUNTER — Ambulatory Visit
Admission: RE | Admit: 2023-01-09 | Discharge: 2023-01-09 | Disposition: A | Discharge status: Home / Self Care | Payer: 59 | Source: Ambulatory Visit | Attending: Family Medicine | Admitting: Family Medicine

## 2023-01-09 DIAGNOSIS — Z1231 Encounter for screening mammogram for malignant neoplasm of breast: Secondary | ICD-10-CM

## 2023-09-24 LAB — LAB REPORT - SCANNED: EGFR: 105

## 2023-12-11 ENCOUNTER — Other Ambulatory Visit: Payer: Self-pay | Admitting: Family Medicine

## 2023-12-11 DIAGNOSIS — Z1231 Encounter for screening mammogram for malignant neoplasm of breast: Secondary | ICD-10-CM

## 2024-01-12 ENCOUNTER — Ambulatory Visit
Admission: RE | Admit: 2024-01-12 | Discharge: 2024-01-12 | Disposition: A | Source: Ambulatory Visit | Attending: Family Medicine | Admitting: Family Medicine

## 2024-01-12 DIAGNOSIS — Z1231 Encounter for screening mammogram for malignant neoplasm of breast: Secondary | ICD-10-CM

## 2024-01-30 ENCOUNTER — Encounter: Payer: Self-pay | Admitting: Nurse Practitioner

## 2024-01-30 ENCOUNTER — Ambulatory Visit: Admitting: Nurse Practitioner

## 2024-01-30 VITALS — BP 132/82 | HR 60 | Ht 61.0 in | Wt 149.0 lb

## 2024-01-30 DIAGNOSIS — Z1331 Encounter for screening for depression: Secondary | ICD-10-CM | POA: Diagnosis not present

## 2024-01-30 DIAGNOSIS — Z01419 Encounter for gynecological examination (general) (routine) without abnormal findings: Secondary | ICD-10-CM

## 2024-01-30 DIAGNOSIS — Z780 Asymptomatic menopausal state: Secondary | ICD-10-CM

## 2024-01-30 NOTE — Progress Notes (Signed)
   Tiffany Davila 1971/03/30 [id number]   History:  53 y.o. G2P2002 presents for annual exam. Postmenopausal - no HRT, no bleeding. Normal pap history.   Gynecologic History Patient's last menstrual period was 04/16/2017.   Contraception/Family planning: post menopausal status Sexually active: No, husband has ED  Health Maintenance Last Pap: 11/22/2021. Results were: ASCUS neg HPV Last mammogram: 01/12/2024. Results were: Normal Last colonoscopy: 09/26/2022, 3-year recall Last Dexa: Not indicated     01/30/2024   11:31 AM  Depression screen PHQ 2/9  Decreased Interest 0  Down, Depressed, Hopeless 0  PHQ - 2 Score 0     Past medical history, past surgical history, family history and social history were all reviewed and documented in the EPIC chart. Married. Works for [employer] in Engineer, maintenance. Son in [employer], just relocated to [place]. Daughter just moved back to [place], working as Lawyer.   ROS:  A ROS was performed and pertinent positives and negatives are included.  Exam:  Vitals:   01/30/24 1123  BP: 132/82  Pulse: 60  SpO2: 96%  Weight: 149 lb (67.6 kg)  Height: 5' 1 (1.549 m)   Body mass index is 28.15 kg/m.  General appearance:  Normal Thyroid :  Symmetrical, normal in size, without palpable masses or nodularity. Respiratory  Auscultation:  Clear without wheezing or rhonchi Cardiovascular  Auscultation:  Regular rate, without rubs, murmurs or gallops  Edema/varicosities:  Not grossly evident Abdominal  Soft,nontender, without masses, guarding or rebound.  Liver/spleen:  No organomegaly noted  Hernia:  None appreciated  Skin  Inspection:  Grossly normal Breasts: Examined lying and sitting.   Right: Without masses, retractions, nipple discharge or axillary adenopathy.   Left: Without masses, retractions, nipple discharge or axillary adenopathy. Pelvic: External genitalia:  no lesions              Urethra:  normal appearing urethra with no masses, tenderness or  lesions              Bartholins and Skenes: normal                 Vagina: normal appearing vagina with normal color and discharge, no lesions              Cervix: no lesions Bimanual Exam:  Uterus:  no masses or tenderness              Adnexa: no mass, fullness, tenderness              Rectovaginal: Deferred              Anus:  normal, no lesions  Dereck Keas, CMA present as chaperone.   Assessment/Plan:  53 y.o. [id number] for annual exam.   Well female exam with routine gynecological exam - Education provided on SBEs, importance of preventative screenings, current guidelines, high calcium diet, regular exercise, and multivitamin daily. Labs with PCP.   Postmenopausal - no HRT, no bleeding.  Screening for cervical cancer - Normal Pap history. 2023 ASCUS neg HPV. Will repeat at 3-year interval per guidelines.  Screening for breast cancer - Normal mammogram history.  Continue annual screenings.  Normal breast exam today.  Screening for colon cancer - 09/2022 colonoscopy. Will repeat at 3-year interval per GI recommendation.   Screening for osteoporosis - Average risk. Will plan DXA at age 34.   Return in about 1 year (around 01/29/2025) for Annual.     Tiffany DELENA Shutter DNP, 11:39 AM 01/30/2024

## 2024-04-23 ENCOUNTER — Ambulatory Visit (INDEPENDENT_AMBULATORY_CARE_PROVIDER_SITE_OTHER): Admitting: Radiology

## 2024-04-23 ENCOUNTER — Encounter: Payer: Self-pay | Admitting: Radiology

## 2024-04-23 VITALS — BP 122/82 | HR 70 | Temp 98.1°F | Wt 154.0 lb

## 2024-04-23 DIAGNOSIS — R8290 Unspecified abnormal findings in urine: Secondary | ICD-10-CM | POA: Diagnosis not present

## 2024-04-23 DIAGNOSIS — N898 Other specified noninflammatory disorders of vagina: Secondary | ICD-10-CM

## 2024-04-23 LAB — URINALYSIS, COMPLETE W/RFL CULTURE
Bacteria, UA: NONE SEEN /HPF
Bilirubin Urine: NEGATIVE
Glucose, UA: NEGATIVE
Hyaline Cast: NONE SEEN /LPF
Ketones, ur: NEGATIVE
Leukocyte Esterase: NEGATIVE
Nitrites, Initial: NEGATIVE
Protein, ur: NEGATIVE
RBC / HPF: NONE SEEN /HPF (ref 0–2)
Specific Gravity, Urine: 1.02 (ref 1.001–1.035)
WBC, UA: NONE SEEN /HPF (ref 0–5)
pH: 6 (ref 5.0–8.0)

## 2024-04-23 LAB — WET PREP FOR TRICH, YEAST, CLUE

## 2024-04-23 LAB — NO CULTURE INDICATED

## 2024-04-23 NOTE — Progress Notes (Signed)
" ° ° ° ° °  Subjective: Tiffany Davila is a 54 y.o. female who complains of lower pelvic pain, white vaginal discharge, cloudy urine. Symptoms began yesterday. No new partners. No change in soaps or detergents.    Review of Systems  All other systems reviewed and are negative.   Past Medical History:  Diagnosis Date   Chest pain    Fibroid 2009   Herpes    Hypertension    Sickle cell trait       Objective:  Today's Vitals   04/23/24 1500  BP: 122/82  Pulse: 70  Temp: 98.1 F (36.7 C)  TempSrc: Oral  SpO2: 99%  Weight: 154 lb (69.9 kg)   Body mass index is 29.1 kg/m.   Physical Exam Vitals and nursing note reviewed. Exam conducted with a chaperone present.  Constitutional:      Appearance: Normal appearance. She is well-developed.  Pulmonary:     Effort: Pulmonary effort is normal.  Abdominal:     General: Abdomen is flat.     Palpations: Abdomen is soft.  Genitourinary:    General: Normal vulva.     Vagina: Vaginal discharge present. No erythema, bleeding or lesions.     Cervix: Normal. No discharge, friability, lesion or erythema.     Uterus: Normal.      Adnexa: Right adnexa normal and left adnexa normal.  Neurological:     Mental Status: She is alert.  Psychiatric:        Mood and Affect: Mood normal.        Thought Content: Thought content normal.        Judgment: Judgment normal.    Urine dipstick shows positive for RBC's (trace).  Micro exam: negative for WBC's or RBC's.  Microscopic wet-mount exam shows negative for pathogens, normal epithelial cells.   Darice Hoit, CMA present for exam  Assessment:/Plan:   1. Cloudy urine (Primary) - Urinalysis,Complete w/RFL Culture  2. Vaginal discharge - WET PREP FOR TRICH, YEAST, CLUE    Avoid the use of soaps or perfumed products in the peri area. Avoid tub baths and sitting in sweaty or wet clothing for prolonged periods of time. F/U if symptoms persist.   Luie Laneve B, NP 3:10 PM  "
